# Patient Record
Sex: Female | Born: 1990 | Race: Black or African American | Hispanic: No | Marital: Single | Smoking: Former smoker
Health system: Southern US, Community
[De-identification: ages and names within clinical notes are randomized; demographics above are authoritative.]

## PROBLEM LIST (undated history)

## (undated) ENCOUNTER — Inpatient Hospital Stay (HOSPITAL_COMMUNITY): Payer: Self-pay

## (undated) DIAGNOSIS — B999 Unspecified infectious disease: Secondary | ICD-10-CM

## (undated) DIAGNOSIS — R51 Headache: Secondary | ICD-10-CM

## (undated) HISTORY — PX: KNEE SURGERY: SHX244

---
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
## 2009-08-28 ENCOUNTER — Emergency Department (HOSPITAL_COMMUNITY): Admission: EM | Admit: 2009-08-28 | Discharge: 2009-08-29 | Payer: Self-pay | Admitting: Emergency Medicine

## 2010-06-22 LAB — URINALYSIS, ROUTINE W REFLEX MICROSCOPIC
Glucose, UA: NEGATIVE mg/dL
Nitrite: NEGATIVE
Specific Gravity, Urine: 1.023 (ref 1.005–1.030)
pH: 5.5 (ref 5.0–8.0)

## 2010-06-22 LAB — POCT PREGNANCY, URINE: Preg Test, Ur: NEGATIVE

## 2010-11-07 ENCOUNTER — Emergency Department (HOSPITAL_COMMUNITY)
Admission: EM | Admit: 2010-11-07 | Discharge: 2010-11-07 | Disposition: A | Discharge status: Home / Self Care | Payer: Self-pay | Attending: Emergency Medicine | Admitting: Emergency Medicine

## 2010-11-07 DIAGNOSIS — H109 Unspecified conjunctivitis: Secondary | ICD-10-CM | POA: Insufficient documentation

## 2012-07-04 ENCOUNTER — Encounter (HOSPITAL_COMMUNITY): Payer: Self-pay | Admitting: *Deleted

## 2012-07-04 ENCOUNTER — Emergency Department (INDEPENDENT_AMBULATORY_CARE_PROVIDER_SITE_OTHER)
Admission: EM | Admit: 2012-07-04 | Discharge: 2012-07-04 | Disposition: A | Discharge status: Home / Self Care | Payer: Medicaid Other | Source: Home / Self Care

## 2012-07-04 DIAGNOSIS — R519 Headache, unspecified: Secondary | ICD-10-CM

## 2012-07-04 DIAGNOSIS — R51 Headache: Secondary | ICD-10-CM

## 2012-07-04 HISTORY — DX: Headache: R51

## 2012-07-04 MED ORDER — KETOROLAC TROMETHAMINE 60 MG/2ML IM SOLN
INTRAMUSCULAR | Status: AC
  Filled 2012-07-04: qty 2

## 2012-07-04 MED ORDER — BUTALBITAL-APAP-CAFFEINE 50-325-40 MG PO TABS: ORAL_TABLET | ORAL | Status: DC

## 2012-07-04 MED ORDER — KETOROLAC TROMETHAMINE 60 MG/2ML IM SOLN
60.0000 mg | Freq: Once | INTRAMUSCULAR | Status: AC
  Administered 2012-07-04: 60 mg via INTRAMUSCULAR

## 2012-07-04 MED ORDER — ONDANSETRON HCL 4 MG/2ML IJ SOLN
4.0000 mg | Freq: Once | INTRAMUSCULAR | Status: AC
  Administered 2012-07-04: 4 mg via INTRAMUSCULAR

## 2012-07-04 MED ORDER — ONDANSETRON HCL 4 MG/2ML IJ SOLN
INTRAMUSCULAR | Status: AC
  Filled 2012-07-04: qty 2

## 2012-07-04 NOTE — ED Provider Notes (Signed)
History     CSN: [id number]  Arrival date & time 07/04/12  1609   None     Chief Complaint  Patient presents with  . Headache    (Consider location/radiation/quality/duration/timing/severity/associated sxs/prior treatment) HPI Comments: 22 -year-old female complaining of a migraine for one week. She states she has never actually been diagnosed with migraine headache but this is similar to her previous ones. Her headache started during her childhood. The pain is located across the fore head. It is described as a pulsating type pain and a tightness around the fore head and temples. It is worse with lifting her head. Nothing seems to make it better. She has photophobia and nausea without vomiting. The pain is constant but waxes and wanes in intensity. Sleep usually helps but not this episode. She denies problems with vision speech hearing swallowing, unifocal weakness or paresthesias.   History reviewed. No pertinent past medical history.  No past surgical history on file.  No family history on file.  History  Substance Use Topics  . Smoking status: Not on file  . Smokeless tobacco: Not on file  . Alcohol Use: Not on file    OB History   Grav Para Term Preterm Abortions TAB SAB Ect Mult Living                  Review of Systems  Constitutional: Negative for fever, activity change and fatigue.  HENT: Negative for nosebleeds, sore throat, trouble swallowing, neck pain, neck stiffness and sinus pressure.   Eyes: Positive for photophobia. Negative for pain, discharge and redness.  Respiratory: Negative for cough, shortness of breath and wheezing.   Cardiovascular: Negative for chest pain and palpitations.  Gastrointestinal: Positive for nausea. Negative for vomiting and abdominal pain.  Genitourinary: Negative.   Musculoskeletal: Negative.   Skin: Negative for color change, pallor and rash.  Neurological: Positive for headaches. Negative for tremors, seizures, syncope, facial  asymmetry, speech difficulty and weakness.  Psychiatric/Behavioral: Negative.     Allergies  Review of patient's allergies indicates no known allergies.  Home Medications   Current Outpatient Rx  Name  Route  Sig  Dispense  Refill  . butalbital-acetaminophen-caffeine (FIORICET) 50-325-40 MG per tablet      Take 2 tablets when you get home and go to bed.   2 tablet   0     BP 120/71  Pulse 61  Temp(Src) 98.1 F (36.7 C) (Oral)  Resp 16  SpO2 100%  Physical Exam  Nursing note and vitals reviewed. Constitutional: She is oriented to person, place, and time. She appears well-developed and well-nourished. No distress.  HENT:  Head: Normocephalic and atraumatic.  Mouth/Throat: Oropharynx is clear and moist. No oropharyngeal exudate.  Eyes: Conjunctivae and EOM are normal. Pupils are equal, round, and reactive to light.  Neck: Normal range of motion. Neck supple.  Cardiovascular: Normal rate and normal heart sounds.   Pulmonary/Chest: Effort normal and breath sounds normal. No respiratory distress.  Abdominal: Soft. There is no tenderness.  Musculoskeletal: Normal range of motion.  Neurological: She is alert and oriented to person, place, and time. She has normal strength. She displays no tremor. No cranial nerve deficit or sensory deficit. She exhibits abnormal muscle tone. She displays a negative Romberg sign. GCS eye subscore is 4. GCS verbal subscore is 5. GCS motor subscore is 6.  Unsteady heel to toe gait  Skin: Skin is warm and dry.  Psychiatric: She has a normal mood and affect.  ED Course  Procedures (including critical care time)  Labs Reviewed - No data to display No results found.   1. Headache in front of head       MDM  Toradol 60mg  IM Zofran 4mg  IM Rx for Fioricet #2 tabs. Take 2 tabs when you get home and go to bed Obtain a doctor for further evaluation and management. No neurologic findings. The patient is stable, fully awake, alert and  oriented. Speech is clear with normal contents.       Hayden Rasmussen, NP 07/04/12 1818

## 2012-07-04 NOTE — ED Notes (Signed)
C/O "migraine" since 3/25 - has hx of HAs, but no migraine dx.  Has been trying sleep, Tyl, IBU, Aleve.  Denies n/v.  C/O photosensitivity.

## 2012-07-06 NOTE — ED Provider Notes (Signed)
Medical screening examination/treatment/procedure(s) were performed by resident physician or non-physician practitioner and as supervising physician I was immediately available for consultation/collaboration.   Barkley Bruns MD.   Linna Hoff, MD 07/06/12 1950

## 2012-09-17 ENCOUNTER — Emergency Department (HOSPITAL_COMMUNITY)
Admission: EM | Admit: 2012-09-17 | Discharge: 2012-09-17 | Disposition: A | Discharge status: Home / Self Care | Payer: Medicaid Other | Attending: Emergency Medicine | Admitting: Emergency Medicine

## 2012-09-17 ENCOUNTER — Encounter (HOSPITAL_COMMUNITY): Payer: Self-pay | Admitting: *Deleted

## 2012-09-17 DIAGNOSIS — N390 Urinary tract infection, site not specified: Secondary | ICD-10-CM | POA: Insufficient documentation

## 2012-09-17 DIAGNOSIS — R197 Diarrhea, unspecified: Secondary | ICD-10-CM | POA: Insufficient documentation

## 2012-09-17 DIAGNOSIS — Z3202 Encounter for pregnancy test, result negative: Secondary | ICD-10-CM | POA: Insufficient documentation

## 2012-09-17 DIAGNOSIS — R1110 Vomiting, unspecified: Secondary | ICD-10-CM

## 2012-09-17 DIAGNOSIS — R112 Nausea with vomiting, unspecified: Secondary | ICD-10-CM | POA: Insufficient documentation

## 2012-09-17 LAB — URINALYSIS, ROUTINE W REFLEX MICROSCOPIC
Ketones, ur: NEGATIVE mg/dL
Nitrite: POSITIVE — AB
Specific Gravity, Urine: 1.026 (ref 1.005–1.030)
Urobilinogen, UA: 0.2 mg/dL (ref 0.0–1.0)

## 2012-09-17 LAB — URINE MICROSCOPIC-ADD ON

## 2012-09-17 LAB — POCT PREGNANCY, URINE: Preg Test, Ur: NEGATIVE

## 2012-09-17 LAB — POCT I-STAT, CHEM 8
HCT: 43 % (ref 36.0–46.0)
Sodium: 140 mEq/L (ref 135–145)

## 2012-09-17 MED ORDER — ONDANSETRON HCL 4 MG/2ML IJ SOLN
4.0000 mg | Freq: Once | INTRAMUSCULAR | Status: AC
  Administered 2012-09-17: 4 mg via INTRAVENOUS
  Filled 2012-09-17: qty 2

## 2012-09-17 MED ORDER — ONDANSETRON 4 MG PO TBDP: 4.0000 mg | ORAL_TABLET | Freq: Three times a day (TID) | ORAL | Status: DC | PRN

## 2012-09-17 MED ORDER — SODIUM CHLORIDE 0.9 % IV BOLUS (SEPSIS)
1000.0000 mL | Freq: Once | INTRAVENOUS | Status: AC
  Administered 2012-09-17: 1000 mL via INTRAVENOUS

## 2012-09-17 MED ORDER — CEPHALEXIN 500 MG PO CAPS: 500.0000 mg | ORAL_CAPSULE | Freq: Four times a day (QID) | ORAL | Status: DC

## 2012-09-17 NOTE — ED Notes (Signed)
Able to tolerate fluids. According to patient the PO fluids stimulated diarrhea. Stated she had a small amount of diarrhea

## 2012-09-17 NOTE — ED Provider Notes (Signed)
History     CSN: [id number]  Arrival date & time 09/17/12  0930   First MD Initiated Contact with Patient 09/17/12 1001      Chief Complaint  Patient presents with  . Abdominal Pain    (Consider location/radiation/quality/duration/timing/severity/associated sxs/prior treatment) HPI Comments: Pt states that she started with vomiting and diarrhea this morning at 2 am:pt states that she had fever or 102 which was resolved with tylenol:pt states that she is hurting all over:denies dyuria:pt think related to something she ate  Patient is a 22 y.o. female presenting with abdominal pain. The history is provided by the patient. No language interpreter was used.  Abdominal Pain This is a new problem. The current episode started today. The problem occurs constantly. The problem has been unchanged. Associated symptoms include abdominal pain, nausea and vomiting. Pertinent negatives include no fever. Nothing aggravates the symptoms. She has tried nothing for the symptoms.    Past Medical History  Diagnosis Date  . WUJWJXBJ(478.2)     Past Surgical History  Procedure Laterality Date  . Knee surgery      No family history on file.  History  Substance Use Topics  . Smoking status: Never Smoker   . Smokeless tobacco: Never Used  . Alcohol Use: No    OB History   Grav Para Term Preterm Abortions TAB SAB Ect Mult Living                  Review of Systems  Constitutional: Negative for fever.  Respiratory: Negative.   Cardiovascular: Negative.   Gastrointestinal: Positive for nausea, vomiting and abdominal pain.    Allergies  Review of patient's allergies indicates no known allergies.  Home Medications   Current Outpatient Rx  Name  Route  Sig  Dispense  Refill  . levonorgestrel (MIRENA) 20 MCG/24HR IUD   Intrauterine   1 each by Intrauterine route once.         . naproxen sodium (ANAPROX) 220 MG tablet   Oral   Take 220 mg by mouth 2 (two) times daily with a meal.          BP 127/72  Pulse 84  Temp(Src) 99.1 F (37.3 C) (Oral)  Resp 20  Ht 5\' 4"  (1.626 m)  Wt 213 lb (96.616 kg)  BMI 36.54 kg/m2  SpO2 98%  LMP 09/08/2012  Physical Exam  Nursing note and vitals reviewed. Constitutional: She is oriented to person, place, and time. She appears well-developed and well-nourished.  HENT:  Head: Normocephalic.  Eyes: Conjunctivae and EOM are normal.  Neck: Neck supple.  Cardiovascular: Normal rate and regular rhythm.   Pulmonary/Chest: Effort normal and breath sounds normal.  Abdominal: Soft. Bowel sounds are normal.  Diffuse tenderness  Neurological: She is alert and oriented to person, place, and time.  Skin: Skin is dry.  Psychiatric: She has a normal mood and affect.    ED Course  Procedures (including critical care time)  Labs Reviewed  URINALYSIS, ROUTINE W REFLEX MICROSCOPIC - Abnormal; Notable for the following:    APPearance CLOUDY (*)    Hgb urine dipstick TRACE (*)    Nitrite POSITIVE (*)    Leukocytes, UA SMALL (*)    All other components within normal limits  URINE MICROSCOPIC-ADD ON - Abnormal; Notable for the following:    Squamous Epithelial / LPF FEW (*)    Bacteria, UA MANY (*)    All other components within normal limits  URINE CULTURE  POCT I-STAT, CHEM  8  POCT PREGNANCY, URINE   No results found.   1. UTI (lower urinary tract infection)   2. Vomiting and diarrhea       MDM  Pt is feeling better at this time and is tolerating WU:JWJX treat BJY:NWGNF sent for culture:abdomen appear benign at this time:don't think imaging is needed:pt instructed on when to return       Teressa Lower, NP 09/17/12 1317

## 2012-09-17 NOTE — ED Notes (Signed)
Denies nausea but cramping is 5/10

## 2012-09-17 NOTE — ED Notes (Signed)
Pt states she began having sharp shooting pains in mid abdomen about 2 am today.  Pt also c/o N/V/D since this morning.  Pt states she had a temperature of 102 PTA.  She took Tylenol to treat and her temp is now 99.1.  Pt states she has had some urinary frequency, but denies hematuria.  Pt ate at a [employer] yesterday and she believes she has food poisoning.  Pt is NAD in triage.  Pt's last BM prior to onset of diarrhea was yesterday afternoon @ 13:00.

## 2012-09-17 NOTE — ED Provider Notes (Signed)
Medical screening examination/treatment/procedure(s) were performed by non-physician practitioner and as supervising physician I was immediately available for consultation/collaboration.  Flint Melter, MD 09/17/12 1539

## 2012-09-19 LAB — URINE CULTURE: Colony Count: >=100000

## 2012-09-20 NOTE — ED Notes (Signed)
Post ED Visit - Positive Culture Follow-up  Culture report reviewed by antimicrobial stewardship pharmacist: []  Wes Dulaney, Pharm.D., BCPS []  Celedonio Miyamoto, Pharm.D., BCPS [x]  Georgina Pillion, [address].D., BCPS []  [place], [address].D., BCPS, AAHIVP []  Estella Husk, Pharm.D., BCPS, AAHIVP  Positive urine culture Treated with Cephalexin, organism sensitive to the same and no further patient follow-up is required at this time.  Larena Sox 09/20/2012, 2:52 PM

## 2012-09-25 LAB — POCT PREGNANCY, URINE: Preg Test, Ur: NEGATIVE

## 2012-12-28 ENCOUNTER — Encounter (HOSPITAL_COMMUNITY): Payer: Self-pay | Admitting: Emergency Medicine

## 2012-12-28 ENCOUNTER — Emergency Department (HOSPITAL_COMMUNITY)
Admission: EM | Admit: 2012-12-28 | Discharge: 2012-12-28 | Disposition: A | Discharge status: Home / Self Care | Payer: Medicaid Other | Attending: Emergency Medicine | Admitting: Emergency Medicine

## 2012-12-28 DIAGNOSIS — S63602A Unspecified sprain of left thumb, initial encounter: Secondary | ICD-10-CM

## 2012-12-28 DIAGNOSIS — X58XXXA Exposure to other specified factors, initial encounter: Secondary | ICD-10-CM | POA: Insufficient documentation

## 2012-12-28 DIAGNOSIS — Z79899 Other long term (current) drug therapy: Secondary | ICD-10-CM | POA: Insufficient documentation

## 2012-12-28 DIAGNOSIS — Y929 Unspecified place or not applicable: Secondary | ICD-10-CM | POA: Insufficient documentation

## 2012-12-28 DIAGNOSIS — Y939 Activity, unspecified: Secondary | ICD-10-CM | POA: Insufficient documentation

## 2012-12-28 DIAGNOSIS — S6390XA Sprain of unspecified part of unspecified wrist and hand, initial encounter: Secondary | ICD-10-CM | POA: Insufficient documentation

## 2012-12-28 NOTE — ED Notes (Signed)
Pt reports L thumb pain x2 weeks, no known injury, increasing pain and inability to move thumb when driving.  Pt reports that she has been using heat and ice at home without relief.

## 2012-12-28 NOTE — ED Provider Notes (Signed)
Medical screening examination/treatment/procedure(s) were performed by non-physician practitioner and as supervising physician I was immediately available for consultation/collaboration.  Geoffery Lyons, MD 12/28/12 2303

## 2012-12-28 NOTE — ED Provider Notes (Signed)
This chart was scribed for Arman Filter NP,  a non-physician practitioner working with Geoffery Lyons, MD by Lewanda Rife, ED Scribe. This patient was seen in room WTR8/WTR8 and the patient's care was started at 2028.    CSN: [id number]     Arrival date & time 12/28/12  1946 History   First MD Initiated Contact with Patient 12/28/12 2011     Chief Complaint  Patient presents with  . Finger Injury   (Consider location/radiation/quality/duration/timing/severity/associated sxs/prior Treatment) The history is provided by the patient.   HPI Comments: Cynthia Woodward is a 22 y.o. female who presents to the Emergency Department complaining of constant moderate left thumb pain onset 2 weeks. Describes pain as throbbing. Denies associated known injury. Reports pain is exacerbated by movement and touch. Reports taking Naproxen and ice with mild relief of symptoms.  Past Medical History  Diagnosis Date  . MWNUUVOZ(366.4)    Past Surgical History  Procedure Laterality Date  . Knee surgery    . Wisdom teeth removal  2006   History reviewed. No pertinent family history. History  Substance Use Topics  . Smoking status: Never Smoker   . Smokeless tobacco: Never Used  . Alcohol Use: Yes     Comment: occ.   OB History   Grav Para Term Preterm Abortions TAB SAB Ect Mult Living                 Review of Systems  Constitutional: Negative for fever.  Musculoskeletal: Positive for arthralgias. Negative for joint swelling.  Skin: Negative for color change and wound.  Neurological: Negative for weakness and numbness.  All other systems reviewed and are negative.   A complete 10 system review of systems was obtained and all systems are negative except as noted in the HPI and PMH.    Allergies  Review of patient's allergies indicates no known allergies.  Home Medications   Current Outpatient Rx  Name  Route  Sig  Dispense  Refill  . levonorgestrel (MIRENA) 20 MCG/24HR IUD  Intrauterine   1 each by Intrauterine route once.         . naproxen sodium (ANAPROX) 220 MG tablet   Oral   Take 220 mg by mouth 2 (two) times daily as needed. For pain          BP 139/80  Pulse 73  Temp(Src) 98.6 F (37 C) (Oral)  Resp 16  Ht 5\' 4"  (1.626 m)  Wt 213 lb (96.616 kg)  BMI 36.54 kg/m2  SpO2 100%  LMP 12/20/2012 Physical Exam  Nursing note and vitals reviewed. Constitutional: She is oriented to person, place, and time. She appears well-developed and well-nourished. No distress.  HENT:  Head: Normocephalic and atraumatic.  Eyes: EOM are normal.  Neck: Neck supple. No tracheal deviation present.  Cardiovascular: Normal rate.   Pulmonary/Chest: Effort normal. No respiratory distress.  Musculoskeletal: Normal range of motion.  Left thumb no crepitus, no joint swelling, no click, full ROM  Pain with full ROM of thumb   Neurological: She is alert and oriented to person, place, and time.  Skin: Skin is warm and dry.  Psychiatric: She has a normal mood and affect. Her behavior is normal.    ED Course  Procedures (including critical care time) Medications - No data to display  Labs Review Labs Reviewed - No data to display Imaging Review No results found.  MDM  No diagnosis found.   I personally performed the services described in  this documentation, which was scribed in my presence. The recorded information has been reviewed and is accurate.   Arman Filter, NP 12/28/12 [phone]

## 2013-02-10 ENCOUNTER — Encounter (HOSPITAL_COMMUNITY): Payer: Self-pay | Admitting: Emergency Medicine

## 2013-02-10 ENCOUNTER — Emergency Department (HOSPITAL_COMMUNITY)
Admission: EM | Admit: 2013-02-10 | Discharge: 2013-02-10 | Disposition: A | Discharge status: Home / Self Care | Payer: Medicaid Other | Attending: Emergency Medicine | Admitting: Emergency Medicine

## 2013-02-10 ENCOUNTER — Emergency Department (HOSPITAL_COMMUNITY): Payer: Medicaid Other

## 2013-02-10 DIAGNOSIS — X500XXA Overexertion from strenuous movement or load, initial encounter: Secondary | ICD-10-CM | POA: Insufficient documentation

## 2013-02-10 DIAGNOSIS — S93409A Sprain of unspecified ligament of unspecified ankle, initial encounter: Secondary | ICD-10-CM | POA: Insufficient documentation

## 2013-02-10 DIAGNOSIS — Y92009 Unspecified place in unspecified non-institutional (private) residence as the place of occurrence of the external cause: Secondary | ICD-10-CM | POA: Insufficient documentation

## 2013-02-10 DIAGNOSIS — Y9389 Activity, other specified: Secondary | ICD-10-CM | POA: Insufficient documentation

## 2013-02-10 DIAGNOSIS — S93401A Sprain of unspecified ligament of right ankle, initial encounter: Secondary | ICD-10-CM

## 2013-02-10 MED ORDER — IBUPROFEN 800 MG PO TABS: 800.0000 mg | ORAL_TABLET | Freq: Three times a day (TID) | ORAL | Status: DC | PRN

## 2013-02-10 MED ORDER — HYDROCODONE-ACETAMINOPHEN 5-325 MG PO TABS: 1.0000 | ORAL_TABLET | Freq: Four times a day (QID) | ORAL | Status: DC | PRN

## 2013-02-10 MED ORDER — HYDROCODONE-ACETAMINOPHEN 5-325 MG PO TABS
1.0000 | ORAL_TABLET | Freq: Once | ORAL | Status: AC
  Administered 2013-02-10: 1 via ORAL
  Filled 2013-02-10: qty 1

## 2013-02-10 NOTE — ED Provider Notes (Signed)
CSN: [id number]     Arrival date & time 02/10/13  1138 History  This chart was scribed for non-physician practitioner Ebbie Ridge, PA,  working with Lyanne Co, MD by Ronal Fear, ED scribe. This patient was seen in room WTR8/WTR8 and the patient's care was started at 11:52 AM.    Chief Complaint  Patient presents with  . Ankle Injury   The history is provided by the patient. No language interpreter was used.    HPI Comments: Cynthia Woodward is a 22 y.o. female who presents to the Emergency Department complaining of sudden onset burning left ankle pain that she believes is a result of coming down on her ankle the wrong way while walking her dog last night. Pt states that afterwards in her home she was reaching up into a cabinet and heard a loud pop with sudden onset pain that is worse with movement and touching. She has taken aleve for the pain with no relief. Pt has a hx of left knee surgery. Pt does not appear to be in any acute distress with no other complaints.  Past Medical History  Diagnosis Date  . WUJWJXBJ(478.2)    Past Surgical History  Procedure Laterality Date  . Knee surgery    . Wisdom teeth removal  2006   No family history on file. History  Substance Use Topics  . Smoking status: Never Smoker   . Smokeless tobacco: Never Used  . Alcohol Use: Yes     Comment: occ.   OB History   Grav Para Term Preterm Abortions TAB SAB Ect Mult Living                 Review of Systems  Musculoskeletal: Positive for arthralgias.  All other systems reviewed and are negative.    Allergies  Review of patient's allergies indicates no known allergies.  Home Medications   Current Outpatient Rx  Name  Route  Sig  Dispense  Refill  . levonorgestrel (MIRENA) 20 MCG/24HR IUD   Intrauterine   1 each by Intrauterine route once.         . naproxen sodium (ANAPROX) 220 MG tablet   Oral   Take 220 mg by mouth 2 (two) times daily as needed. For pain          BP 126/67   Pulse 85  Temp(Src) 97.9 F (36.6 C) (Oral)  Resp 18  SpO2 98%  LMP 02/05/2013 Physical Exam  Nursing note and vitals reviewed. Constitutional: She is oriented to person, place, and time. She appears well-developed and well-nourished.  HENT:  Head: Normocephalic and atraumatic.  Eyes: Pupils are equal, round, and reactive to light.  Pulmonary/Chest: Effort normal.  Musculoskeletal: She exhibits edema (mild edema in left ankle) and tenderness (left ankle).  Neurological: She is alert and oriented to person, place, and time.  Skin: Skin is warm and dry.  Psychiatric: She has a normal mood and affect.    ED Course  Procedures (including critical care time) DIAGNOSTIC STUDIES: Oxygen Saturation is 98% on RA, normal by my interpretation.    COORDINATION OF CARE:    11:57 AM- Pt advised of plan for treatment including Xray of her left ankle and pt agrees.  I personally performed the services described in this documentation, which was scribed in my presence. The recorded information has been reviewed and is accurate. Patient be placed in an ASO with crutches.  Told to followup with orthopedics, to ice and elevate her ankle.  Patient is given the x-ray results  Carlyle Dolly, PA-C 02/10/13 1303  Carlyle Dolly, PA-C 02/10/13 [phone]

## 2013-02-10 NOTE — ED Provider Notes (Signed)
Medical screening examination/treatment/procedure(s) were performed by non-physician practitioner and as supervising physician I was immediately available for consultation/collaboration.  EKG Interpretation   None         Heinrich Fertig M Alletta Mattos, MD 02/10/13 1559 

## 2013-02-10 NOTE — ED Notes (Signed)
Pt reports walking her dog around midnight, was walking down hill and thinks she injured ankle - states when she got home she was reaching for something on a shelf, heard a pop from ankle. States ankle has been "on fire" since.

## 2013-04-25 ENCOUNTER — Encounter (HOSPITAL_COMMUNITY): Payer: Self-pay | Admitting: Emergency Medicine

## 2013-04-25 ENCOUNTER — Emergency Department (HOSPITAL_COMMUNITY)
Admission: EM | Admit: 2013-04-25 | Discharge: 2013-04-25 | Disposition: A | Discharge status: Home / Self Care | Payer: Medicaid Other | Attending: Emergency Medicine | Admitting: Emergency Medicine

## 2013-04-25 DIAGNOSIS — J069 Acute upper respiratory infection, unspecified: Secondary | ICD-10-CM | POA: Insufficient documentation

## 2013-04-25 DIAGNOSIS — H9209 Otalgia, unspecified ear: Secondary | ICD-10-CM | POA: Insufficient documentation

## 2013-04-25 LAB — RAPID STREP SCREEN (MED CTR MEBANE ONLY): Streptococcus, Group A Screen (Direct): NEGATIVE

## 2013-04-25 MED ORDER — HYDROCODONE-HOMATROPINE 5-1.5 MG/5ML PO SYRP: 5.0000 mL | ORAL_SOLUTION | Freq: Four times a day (QID) | ORAL | Status: DC | PRN

## 2013-04-25 MED ORDER — DIPHENHYDRAMINE HCL 25 MG PO TABS: 25.0000 mg | ORAL_TABLET | Freq: Four times a day (QID) | ORAL | Status: DC | PRN

## 2013-04-25 NOTE — ED Notes (Signed)
Pt escorted to discharge window. Pt verbalized understanding discharge instructions. In no acute distress.  

## 2013-04-25 NOTE — ED Provider Notes (Signed)
CSN: [id number]     Arrival date & time 04/25/13  1203 History  This chart was scribed for non-physician practitioner Emilia BeckKaitlyn Janel Beane, PA-C working with Laray AngerKathleen M McManus, DO by Leone PayorSonum Patel, ED Scribe. This patient was seen in room WTR7/WTR7 and the patient's care was started at 1203.    Chief Complaint  Patient presents with  . Cough  . Sore Throat    The history is provided by the patient. No language interpreter was used.    HPI Comments: Cynthia Woodward is a 23 y.o. female who presents to the Emergency Department complaining of 1 week of gradual onset, gradually worsening, constant cough productive of clear sputum. She has associated rhinorrhea productive of yellow mucus, sore throat, ear pain. She states the cough causes her to have trouble with deep breathing. She has tried OTC cough medication, cough drops without relief. She reports sick contacts with similar symptoms. She denies fever, chills.    Past Medical History  Diagnosis Date  . ZHYQMVHQ(469.6Headache(784.0)    Past Surgical History  Procedure Laterality Date  . Knee surgery    . Wisdom teeth removal  2006   History reviewed. No pertinent family history. History  Substance Use Topics  . Smoking status: Never Smoker   . Smokeless tobacco: Never Used  . Alcohol Use: Yes     Comment: occ.   OB History   Grav Para Term Preterm Abortions TAB SAB Ect Mult Living                 Review of Systems  Constitutional: Negative for fever, chills and fatigue.  HENT: Positive for ear pain, rhinorrhea and sore throat. Negative for trouble swallowing.   Eyes: Negative for visual disturbance.  Respiratory: Positive for cough. Negative for shortness of breath.   Cardiovascular: Negative for chest pain and palpitations.  Gastrointestinal: Negative for nausea, vomiting, abdominal pain and diarrhea.  Genitourinary: Negative for dysuria and difficulty urinating.  Musculoskeletal: Negative for arthralgias and neck pain.  Skin: Negative for  color change.  Neurological: Negative for dizziness and weakness.  Psychiatric/Behavioral: Negative for dysphoric mood.    Allergies  Review of patient's allergies indicates no known allergies.  Home Medications   Current Outpatient Rx  Name  Route  Sig  Dispense  Refill  . levonorgestrel (MIRENA) 20 MCG/24HR IUD   Intrauterine   1 each by Intrauterine route once.          . naproxen sodium (ANAPROX) 220 MG tablet   Oral   Take 440 mg by mouth 2 (two) times daily as needed (For headache). For pain          BP 124/82  Pulse 64  Temp(Src) 98.4 F (36.9 C) (Oral)  Resp 12  SpO2 97%  LMP 04/18/2013 Physical Exam  Nursing note and vitals reviewed. Constitutional: She is oriented to person, place, and time. She appears well-developed and well-nourished. No distress.  HENT:  Head: Normocephalic and atraumatic.  Right Ear: External ear normal.  Left Ear: External ear normal.  Mouth/Throat: Oropharynx is clear and moist. No oropharyngeal exudate.  No sinus tenderness to palpation.   Eyes: Conjunctivae and EOM are normal. Pupils are equal, round, and reactive to light.  Neck: Normal range of motion. Neck supple.  Cardiovascular: Normal rate, regular rhythm and normal heart sounds.  Exam reveals no gallop and no friction rub.   No murmur heard. Pulmonary/Chest: Effort normal and breath sounds normal. No respiratory distress. She has no wheezes. She has  no rales. She exhibits no tenderness.  Abdominal: Soft. She exhibits no distension. There is no tenderness.  Musculoskeletal: Normal range of motion.  Lymphadenopathy:    She has no cervical adenopathy.  Neurological: She is alert and oriented to person, place, and time.  Speech is goal-oriented. Moves limbs without ataxia.   Skin: Skin is warm and dry.  Psychiatric: She has a normal mood and affect.    ED Course  Procedures (including critical care time)  DIAGNOSTIC STUDIES: Oxygen Saturation is 97% on RA, adequate by  my interpretation.    COORDINATION OF CARE: 1:09 PM Will treat patient symptomatically with Hycodan and benadryl. Discussed treatment plan with pt at bedside and pt agreed to plan.   Labs Review Labs Reviewed  RAPID STREP SCREEN  CULTURE, GROUP A STREP   Imaging Review No results found.  EKG Interpretation   None       MDM   1. URI (upper respiratory infection)    Patient likely has viral illness. Patient will have hycodan and benadryl for symptoms. Patient advised to return with worsening or concerning symptoms.   I personally performed the services described in this documentation, which was scribed in my presence. The recorded information has been reviewed and is accurate.   Emilia Beck, PA-C 04/25/13 1757  Emilia Beck, PA-C 04/25/13 1757

## 2013-04-25 NOTE — ED Notes (Signed)
Pt c/o cough and sore throat x 1 wk.  Sick kids in the house.

## 2013-04-25 NOTE — Discharge Instructions (Signed)
Take hycodan as needed for cough. Take benadryl as needed for congestion. Refer to attached documents for more information.

## 2013-04-25 NOTE — ED Notes (Signed)
Pt alert and oriented x4. Respirations even and unlabored, bilateral symmetrical rise and fall of chest. Skin warm and dry. In no acute distress. Denies needs.   

## 2013-04-26 NOTE — ED Provider Notes (Signed)
Medical screening examination/treatment/procedure(s) were performed by non-physician practitioner and as supervising physician I was immediately available for consultation/collaboration.  EKG Interpretation   None         Laray AngerKathleen M Joncarlo Friberg, DO 04/26/13 [phone]

## 2013-04-27 LAB — CULTURE, GROUP A STREP

## 2013-06-14 ENCOUNTER — Encounter (HOSPITAL_COMMUNITY): Payer: Self-pay | Admitting: Emergency Medicine

## 2013-06-14 ENCOUNTER — Emergency Department (HOSPITAL_COMMUNITY)
Admission: EM | Admit: 2013-06-14 | Discharge: 2013-06-14 | Disposition: A | Discharge status: Home / Self Care | Payer: Medicaid Other | Attending: Emergency Medicine | Admitting: Emergency Medicine

## 2013-06-14 DIAGNOSIS — M5416 Radiculopathy, lumbar region: Secondary | ICD-10-CM

## 2013-06-14 DIAGNOSIS — IMO0002 Reserved for concepts with insufficient information to code with codable children: Secondary | ICD-10-CM | POA: Insufficient documentation

## 2013-06-14 DIAGNOSIS — M25579 Pain in unspecified ankle and joints of unspecified foot: Secondary | ICD-10-CM | POA: Insufficient documentation

## 2013-06-14 MED ORDER — PREDNISONE 20 MG PO TABS: 40.0000 mg | ORAL_TABLET | Freq: Every day | ORAL | Status: DC

## 2013-06-14 MED ORDER — TRAMADOL HCL 50 MG PO TABS: 50.0000 mg | ORAL_TABLET | Freq: Four times a day (QID) | ORAL | Status: DC | PRN

## 2013-06-14 MED ORDER — IBUPROFEN 600 MG PO TABS: 600.0000 mg | ORAL_TABLET | Freq: Four times a day (QID) | ORAL | Status: DC | PRN

## 2013-06-14 NOTE — ED Provider Notes (Signed)
CSN: [id number]     Arrival date & time 06/14/13  1813 History  This chart was scribed for non-physician practitioner, Jaynie Crumbleatyana Rheya Minogue, PA-C,working with Ward GivensIva L Knapp, MD, by Karle PlumberJennifer Tensley, ED Scribe.  This patient was seen in room WTR8/WTR8 and the patient's care was started at 7:01 PM.  Chief Complaint  Patient presents with  . Foot Pain  . Leg Pain   The history is provided by the patient. No language interpreter was used.   HPI Comments:  Cynthia Woodward is a 23 y.o. obese female who presents to the Emergency Department complaining of intermittent sharp shooting pains that start in the feet and radiate up the legs bilaterally at varying times that last only a few seconds at a time. She states this has been going on for approximately one week. She states it is "the most excruciating pain" she has ever experienced. She states the pain comes on at random times and nothing makes the pain worse. She reports only getting relief from lying completely still. She states she has soaked in warm baths with no relief. She denies h/o DM, back problems, injury, or trauma to the feet or legs. She reports not having a PCP.   Past Medical History  Diagnosis Date  . [id number](478.2Headache(784.0)    Past Surgical History  Procedure Laterality Date  . Knee surgery    . Wisdom teeth removal  2006   No family history on file. History  Substance Use Topics  . Smoking status: Never Smoker   . Smokeless tobacco: Never Used  . Alcohol Use: Yes     Comment: occ.   OB History   Grav Para Term Preterm Abortions TAB SAB Ect Mult Living                 Review of Systems  Musculoskeletal: Positive for arthralgias (bilateral feet). Negative for back pain.  Neurological: Negative for weakness and numbness.    Allergies  Review of patient's allergies indicates no known allergies.  Home Medications   Current Outpatient Rx  Name  Route  Sig  Dispense  Refill  . diphenhydrAMINE (BENADRYL) 25 MG tablet   Oral  Take 1 tablet (25 mg total) by mouth every 6 (six) hours as needed for itching (Rash).   30 tablet   0   . HYDROcodone-homatropine (HYCODAN) 5-1.5 MG/5ML syrup   Oral   Take 5 mLs by mouth every 6 (six) hours as needed for cough.   120 mL   0   . levonorgestrel (MIRENA) 20 MCG/24HR IUD   Intrauterine   1 each by Intrauterine route once.          . naproxen sodium (ANAPROX) 220 MG tablet   Oral   Take 440 mg by mouth 2 (two) times daily as needed (For headache). For pain          Triage Vitals: BP 131/81  Pulse 66  Temp(Src) 98.7 F (37.1 C) (Oral)  Resp 18  SpO2 100%  LMP 06/05/2013 Physical Exam  Nursing note and vitals reviewed. Constitutional: She is oriented to person, place, and time. She appears well-developed and well-nourished.  HENT:  Head: Normocephalic and atraumatic.  Eyes: EOM are normal.  Neck: Normal range of motion.  Cardiovascular: Normal rate and intact distal pulses.   Dorsal pedal pulses intact bilaterally  Pulmonary/Chest: Effort normal.  Musculoskeletal: Normal range of motion.  No midline lumbar spine tenderness. No pain with bilateral straight leg raise. No calf pain or swelling  Neurological: She is alert and oriented to person, place, and time. She has normal reflexes.  5/5 and equal lower extremity strength. 2+ and equal patellar reflexes bilaterally. Pt able to dorsiflex bilateral toes and feet with good strength against resistance. Equal sensation bilaterally over thighs and lower legs.   Skin: Skin is warm and dry.  Psychiatric: She has a normal mood and affect. Her behavior is normal.    ED Course  Procedures (including critical care time) DIAGNOSTIC STUDIES: Oxygen Saturation is 100% on RA, normal by my interpretation.   COORDINATION OF CARE: 7:07 PM- Will prescribe steroids and give referral to orthopedist. Pt verbalizes understanding and agrees to plan.  Medications - No data to display  Labs Review Labs Reviewed - No data  to display Imaging Review No results found.   EKG Interpretation None      MDM   Final diagnoses:  Radiculopathy, lumbar region    Pt with non specific sharp, shooting pain in both legs from toes to the thighs. No numbness or weakness. Normal exam. Neurovascularly intact. I am unable to reproduce her pain.  Unsure exact cause. Suspect could be radicular pain from lumbar spine. Will try prednisone. NSAIDs, Ultram. Follow up with pcp or orthopedics.    Filed Vitals:   06/14/13 1849  BP: 131/81  Pulse: 66  Temp: 98.7 F (37.1 C)  TempSrc: Oral  Resp: 18  SpO2: 100%     I personally performed the services described in this documentation, which was scribed in my presence. The recorded information has been reviewed and is accurate.    Lottie Mussel, PA-C 06/14/13 2020

## 2013-06-14 NOTE — Discharge Instructions (Signed)
Start taking medications as prescribed. Please follow up with primary care doctor or orthopedics specialist if not improving.   Radicular Pain Radicular pain in either the arm or leg is usually from a bulging or herniated disk in the spine. A piece of the herniated disk may press against the nerves as the nerves exit the spine. This causes pain which is felt at the tips of the nerves down the arm or leg. Other causes of radicular pain may include:  Fractures.  Heart disease.  Cancer.  An abnormal and usually degenerative state of the nervous system or nerves (neuropathy). Diagnosis may require CT or MRI scanning to determine the primary cause.  Nerves that start at the neck (nerve roots) may cause radicular pain in the outer shoulder and arm. It can spread down to the thumb and fingers. The symptoms vary depending on which nerve root has been affected. In most cases radicular pain improves with conservative treatment. Neck problems may require physical therapy, a neck collar, or cervical traction. Treatment may take many weeks, and surgery may be considered if the symptoms do not improve.  Conservative treatment is also recommended for sciatica. Sciatica causes pain to radiate from the lower back or buttock area down the leg into the foot. Often there is a history of back problems. Most patients with sciatica are better after 2 to 4 weeks of rest and other supportive care. Short term bed rest can reduce the disk pressure considerably. Sitting, however, is not a good position since this increases the pressure on the disk. You should avoid bending, lifting, and all other activities which make the problem worse. Traction can be used in severe cases. Surgery is usually reserved for patients who do not improve within the first months of treatment. Only take over-the-counter or prescription medicines for pain, discomfort, or fever as directed by your caregiver. Narcotics and muscle relaxants may help by  relieving more severe pain and spasm and by providing mild sedation. Cold or massage can give significant relief. Spinal manipulation is not recommended. It can increase the degree of disc protrusion. Epidural steroid injections are often effective treatment for radicular pain. These injections deliver medicine to the spinal nerve in the space between the protective covering of the spinal cord and back bones (vertebrae). Your caregiver can give you more information about steroid injections. These injections are most effective when given within two weeks of the onset of pain.  You should see your caregiver for follow up care as recommended. A program for neck and back injury rehabilitation with stretching and strengthening exercises is an important part of management.  SEEK IMMEDIATE MEDICAL CARE IF:  You develop increased pain, weakness, or numbness in your arm or leg.  You develop difficulty with bladder or bowel control.  You develop abdominal pain. Document Released: 04/29/2004 Document Revised: 06/14/2011 Document Reviewed: 07/15/2008 [facility] Patient Information 2014 [place], [place].

## 2013-06-14 NOTE — ED Notes (Addendum)
Pt c/o bilat feet pain that radiates up to her upper thig that has been going on intermittently for about week.

## 2013-06-15 NOTE — ED Provider Notes (Signed)
Medical screening examination/treatment/procedure(s) were performed by non-physician practitioner and as supervising physician I was immediately available for consultation/collaboration.   EKG Interpretation None        Cynthia Woodward S Shyvonne Chastang, MD 06/15/13 0825 

## 2014-11-21 IMAGING — CR DG ANKLE COMPLETE 3+V*L*
3 series · 3 of 3 positions shown · non-contrast
Comparison: None.

CLINICAL DATA: Left ankle pain post twisted ankle

EXAM:
LEFT ANKLE COMPLETE - 3+ VIEW

[x ankle ap left]
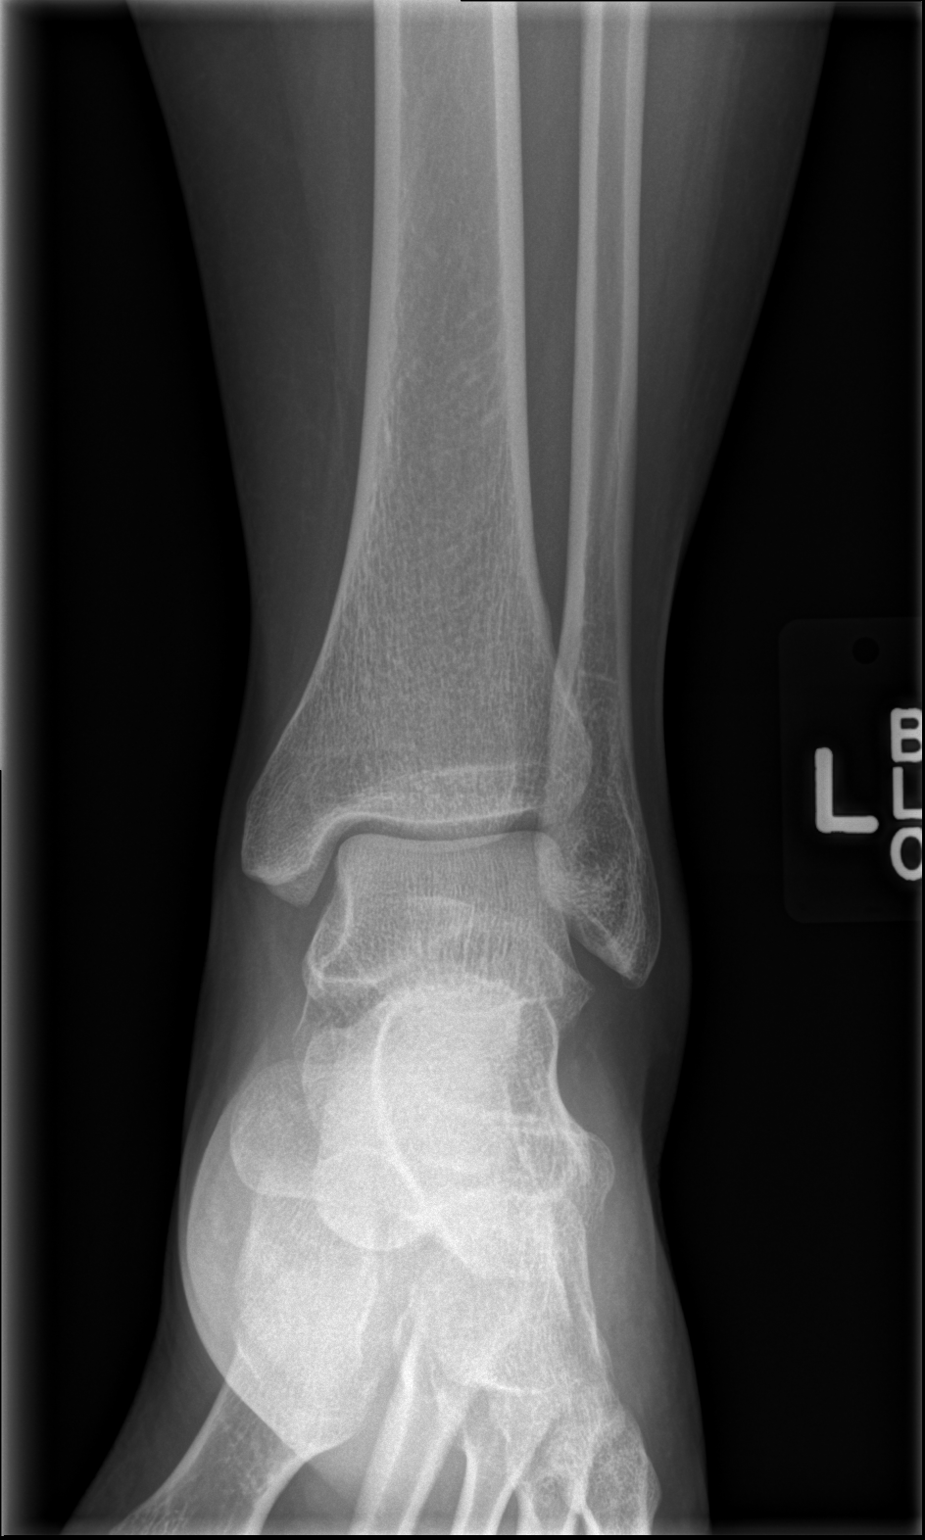

[x ankle obl left]
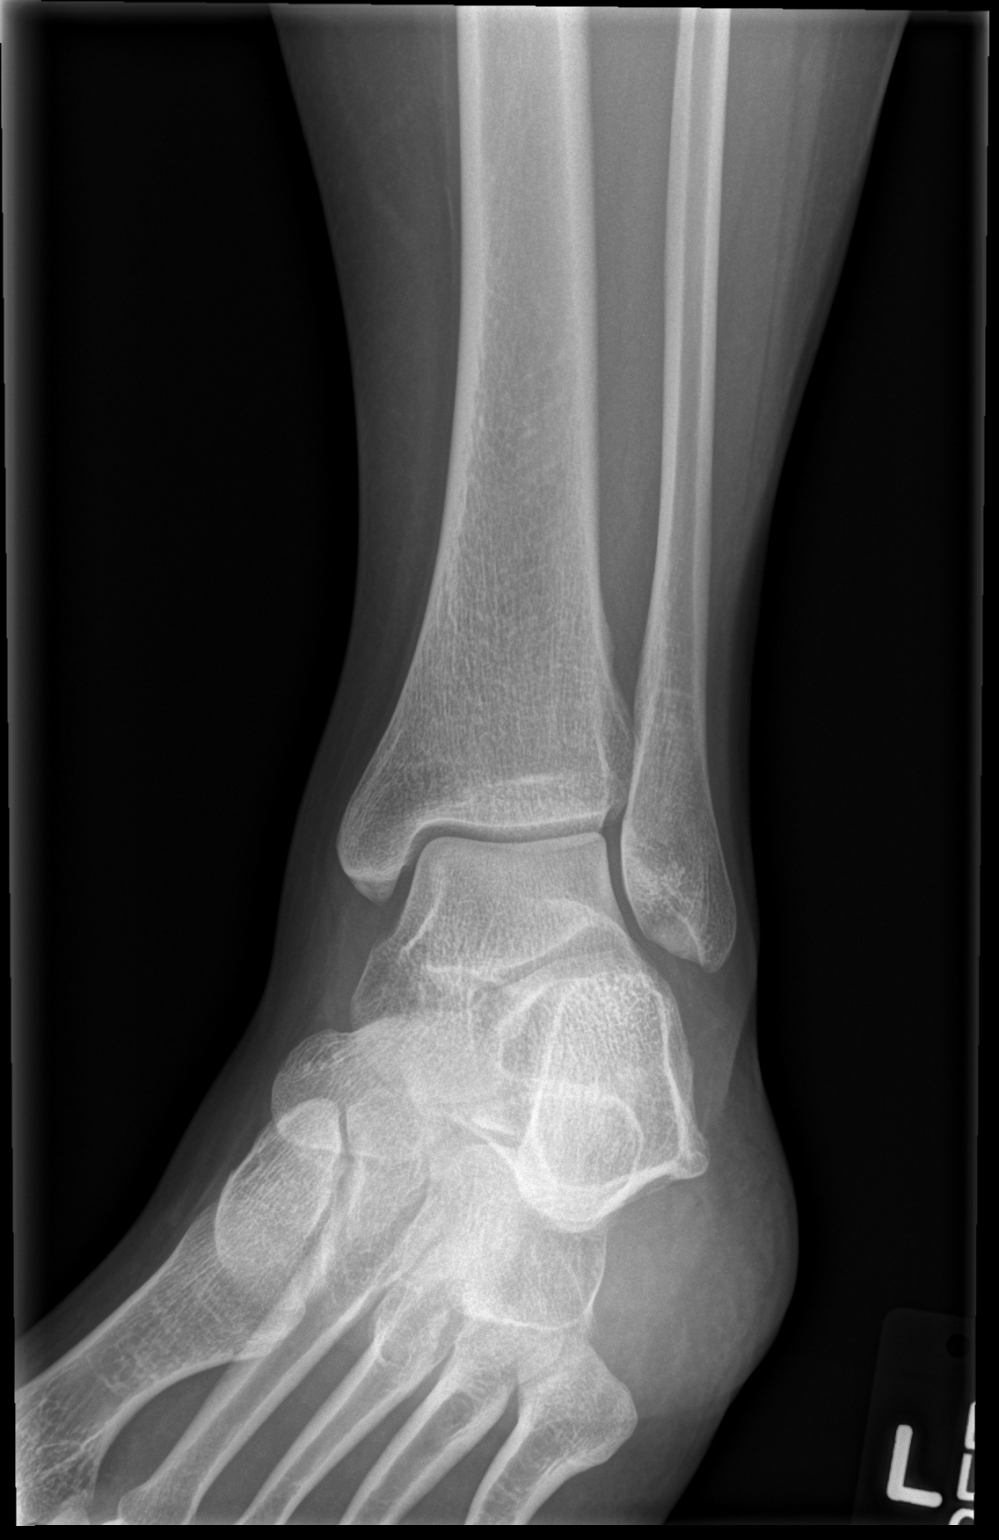

[x ankle lat left]
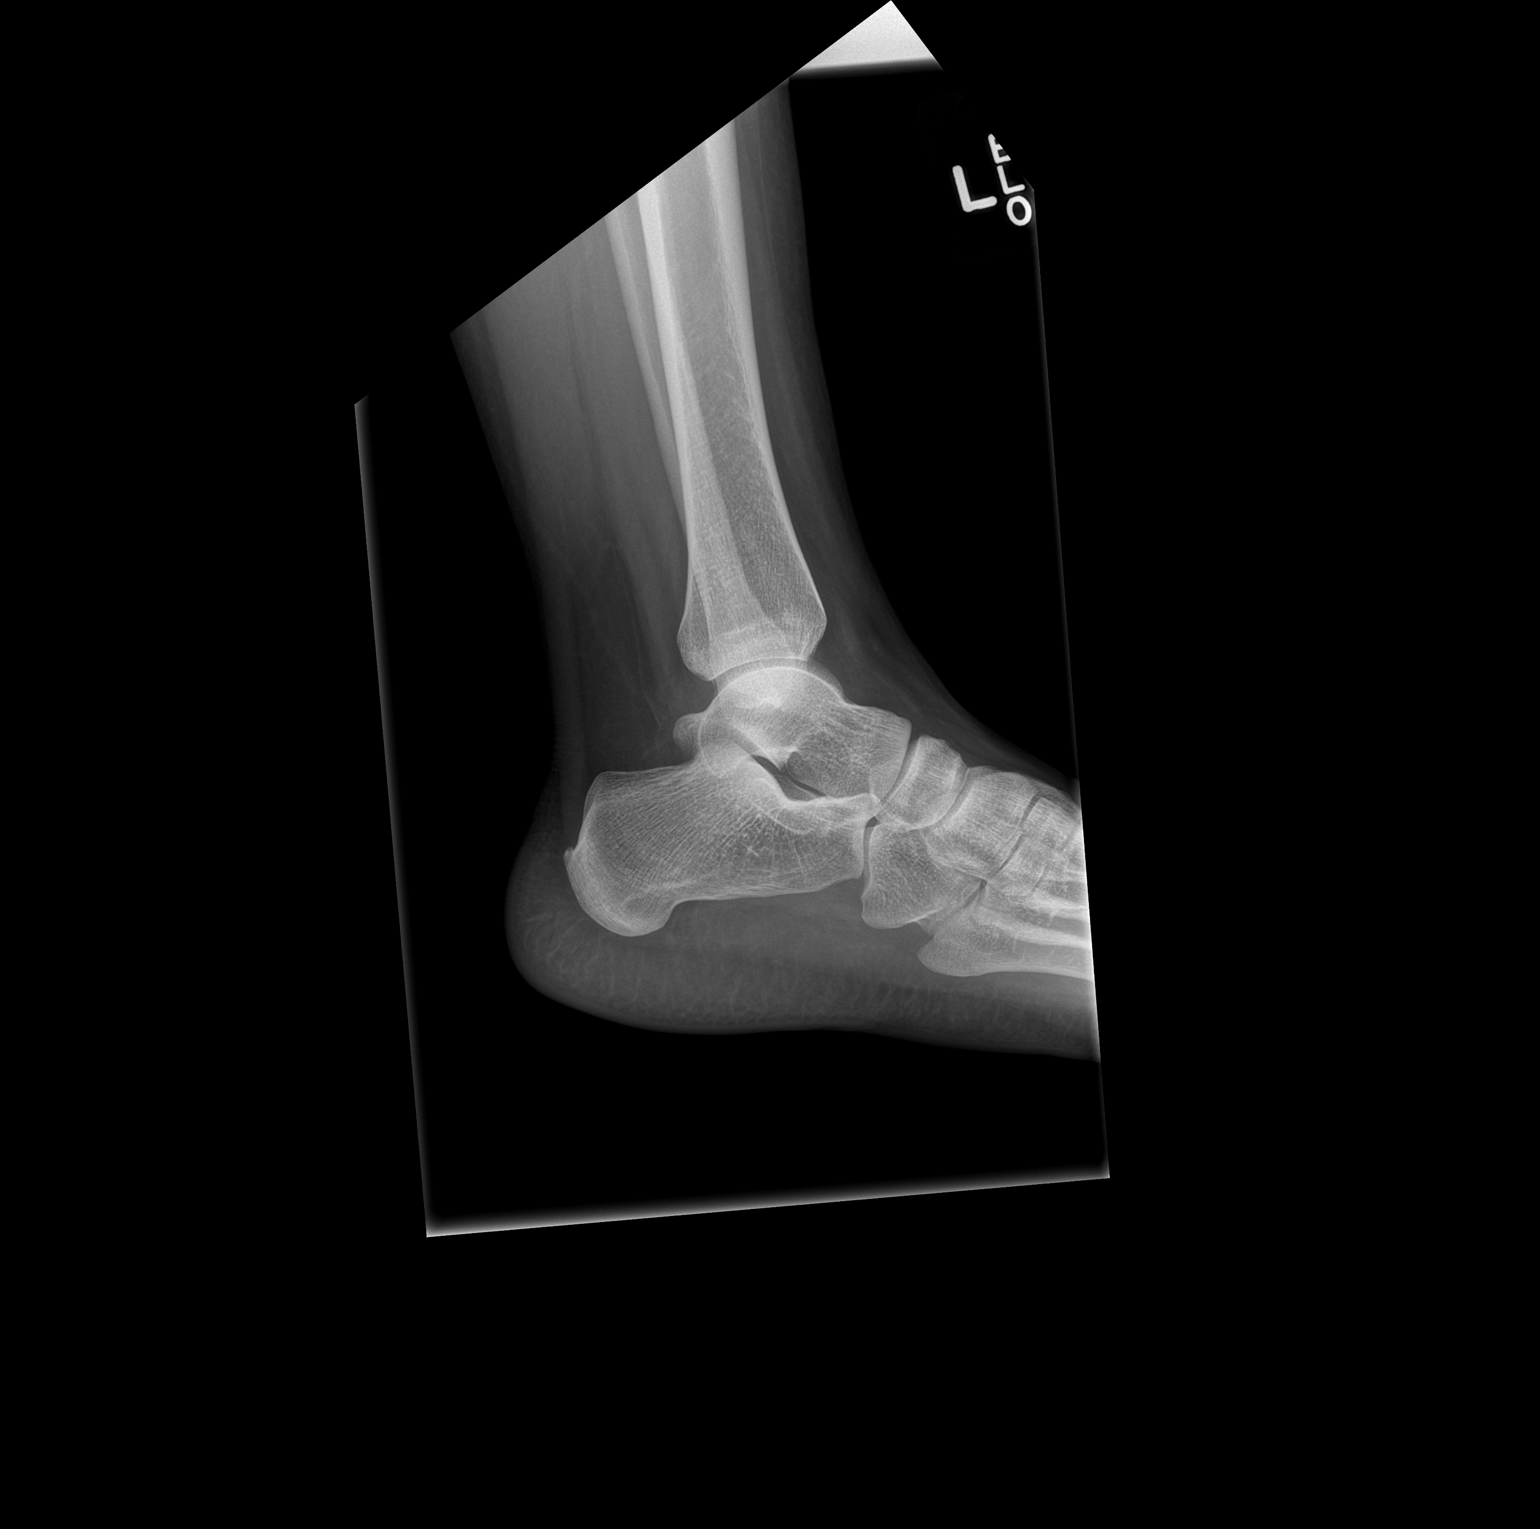

[3 of 3 positions shown; findings below may reference images not displayed]

FINDINGS: Three views of left ankle submitted. No acute fracture or
subluxation. No radiopaque foreign body. Ankle mortise is preserved.
IMPRESSION: No acute fracture or subluxation.

## 2015-05-26 ENCOUNTER — Other Ambulatory Visit: Payer: Self-pay | Admitting: Family Medicine

## 2015-05-26 ENCOUNTER — Ambulatory Visit
Admission: RE | Admit: 2015-05-26 | Discharge: 2015-05-26 | Disposition: A | Discharge status: Home / Self Care | Payer: Managed Care, Other (non HMO) | Source: Ambulatory Visit | Attending: Family Medicine | Admitting: Family Medicine

## 2015-05-26 DIAGNOSIS — R52 Pain, unspecified: Secondary | ICD-10-CM

## 2016-04-19 DIAGNOSIS — F419 Anxiety disorder, unspecified: Secondary | ICD-10-CM | POA: Diagnosis not present

## 2016-04-19 DIAGNOSIS — R51 Headache: Secondary | ICD-10-CM | POA: Diagnosis not present

## 2016-06-28 DIAGNOSIS — Z3040 Encounter for surveillance of contraceptives, unspecified: Secondary | ICD-10-CM | POA: Diagnosis not present

## 2016-06-28 DIAGNOSIS — Z113 Encounter for screening for infections with a predominantly sexual mode of transmission: Secondary | ICD-10-CM | POA: Diagnosis not present

## 2016-06-28 DIAGNOSIS — Z01419 Encounter for gynecological examination (general) (routine) without abnormal findings: Secondary | ICD-10-CM | POA: Diagnosis not present

## 2016-11-25 DIAGNOSIS — F419 Anxiety disorder, unspecified: Secondary | ICD-10-CM | POA: Diagnosis not present

## 2016-11-25 DIAGNOSIS — R51 Headache: Secondary | ICD-10-CM | POA: Diagnosis not present

## 2016-12-22 DIAGNOSIS — N926 Irregular menstruation, unspecified: Secondary | ICD-10-CM | POA: Diagnosis not present

## 2017-01-05 DIAGNOSIS — Z30432 Encounter for removal of intrauterine contraceptive device: Secondary | ICD-10-CM | POA: Diagnosis not present

## 2017-01-05 DIAGNOSIS — N926 Irregular menstruation, unspecified: Secondary | ICD-10-CM | POA: Diagnosis not present

## 2017-03-05 IMAGING — CR DG TOE 4TH 2+V*R*
3 series · 3 of 3 positions shown · non-contrast
Comparison: None.

CLINICAL DATA: 24-year-old female hit fourth toe 05/25/2015. Pain.
Initial encounter.

EXAM:
RIGHT FOURTH TOE

[view not recorded (1 of 3)]
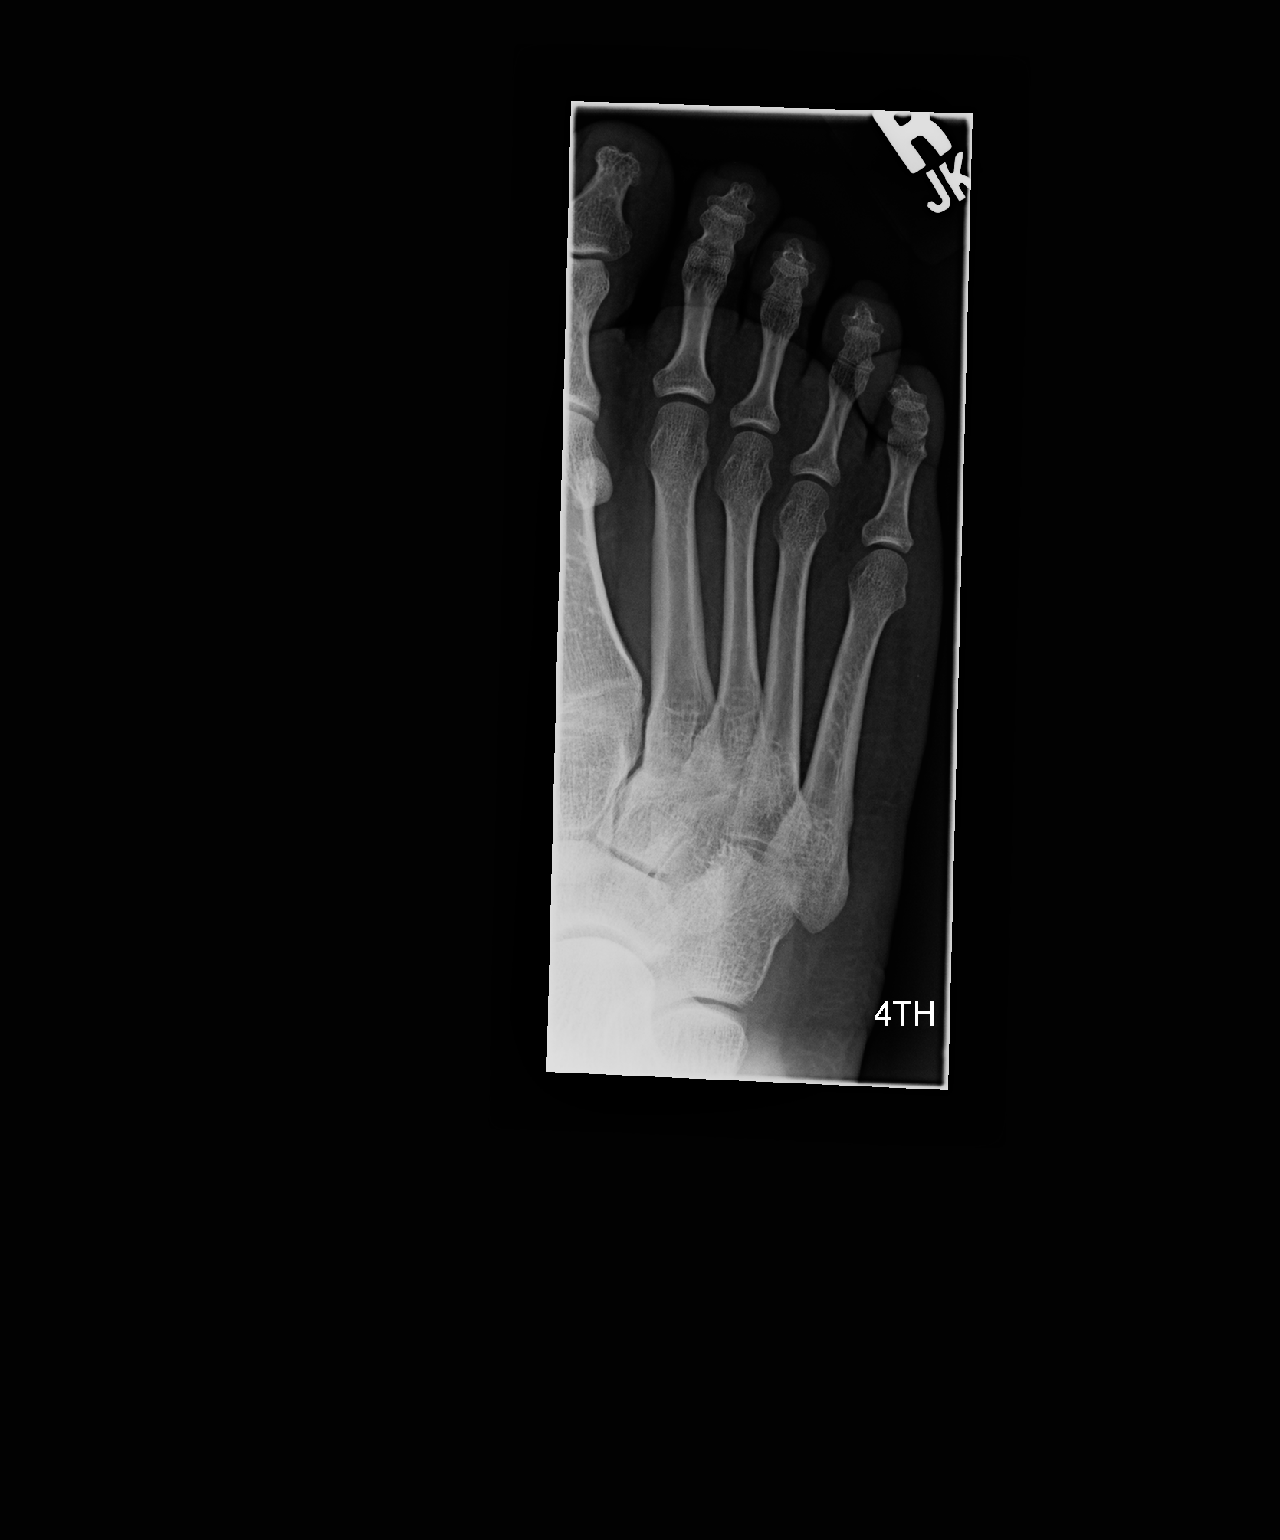

[view not recorded (2 of 3)]
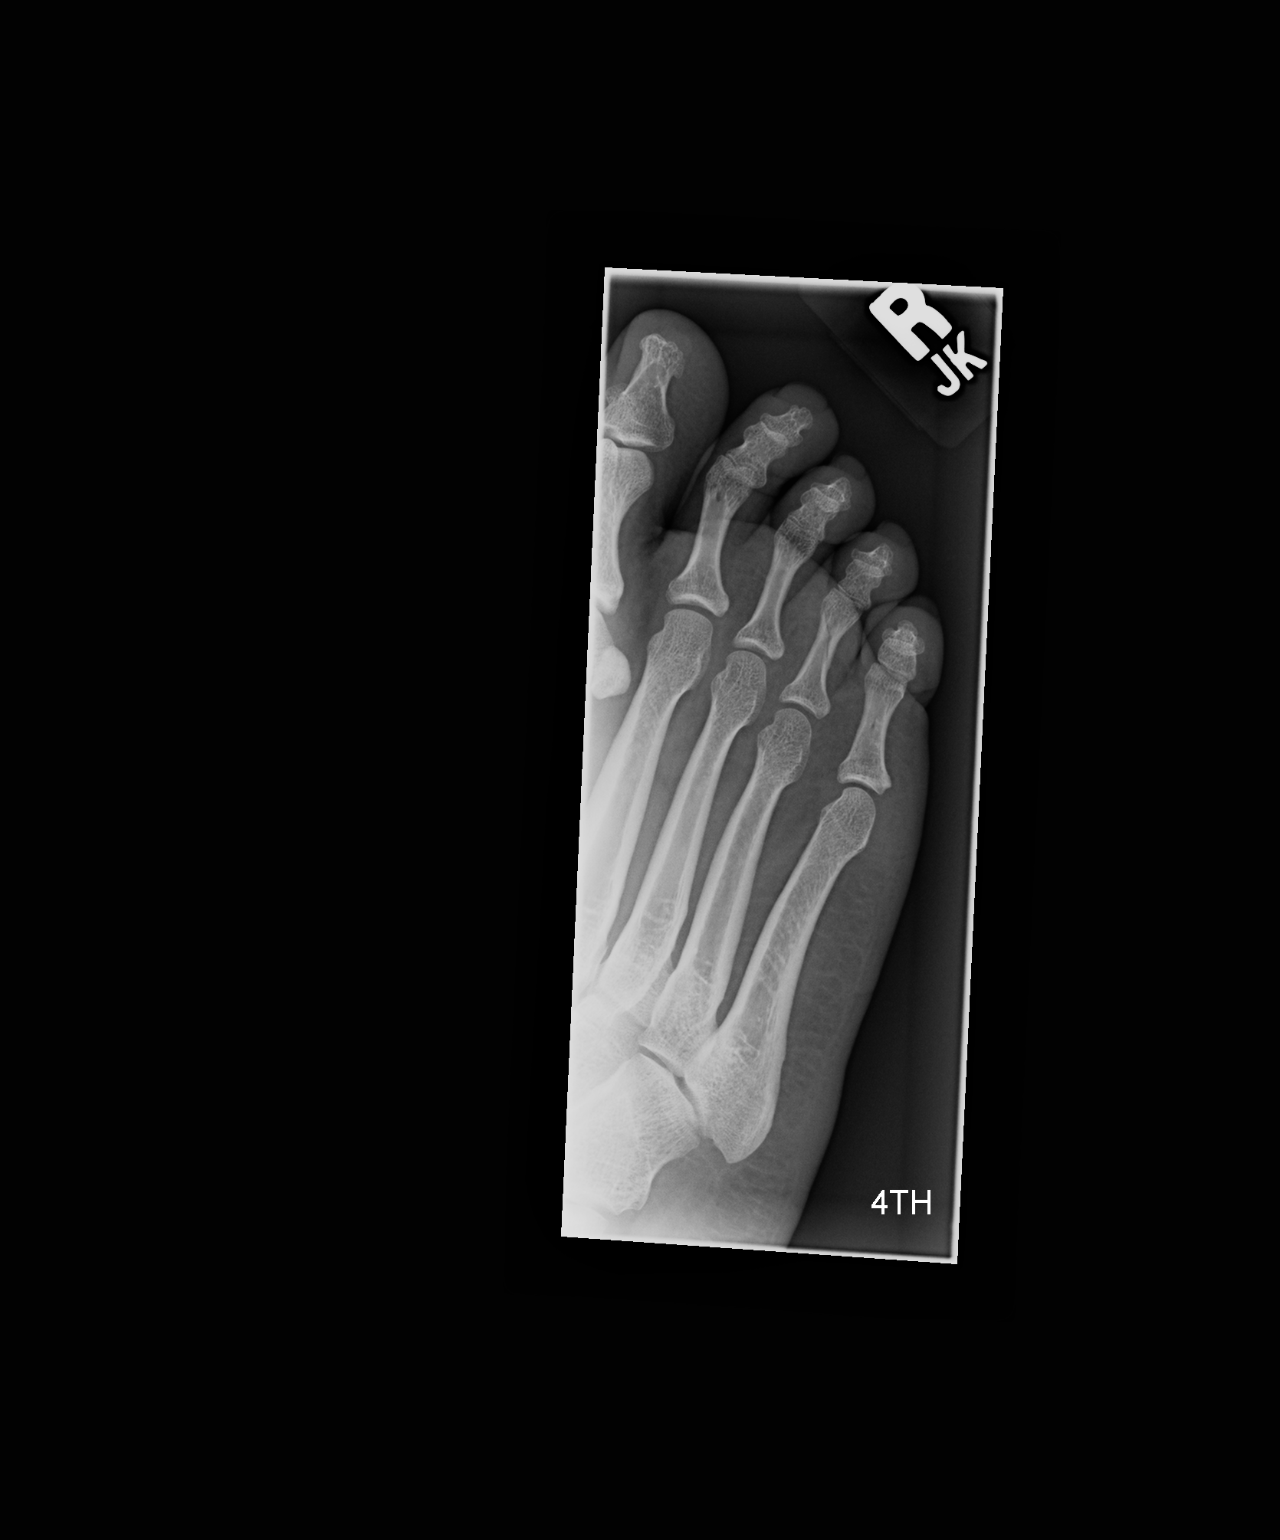

[view not recorded (3 of 3)]
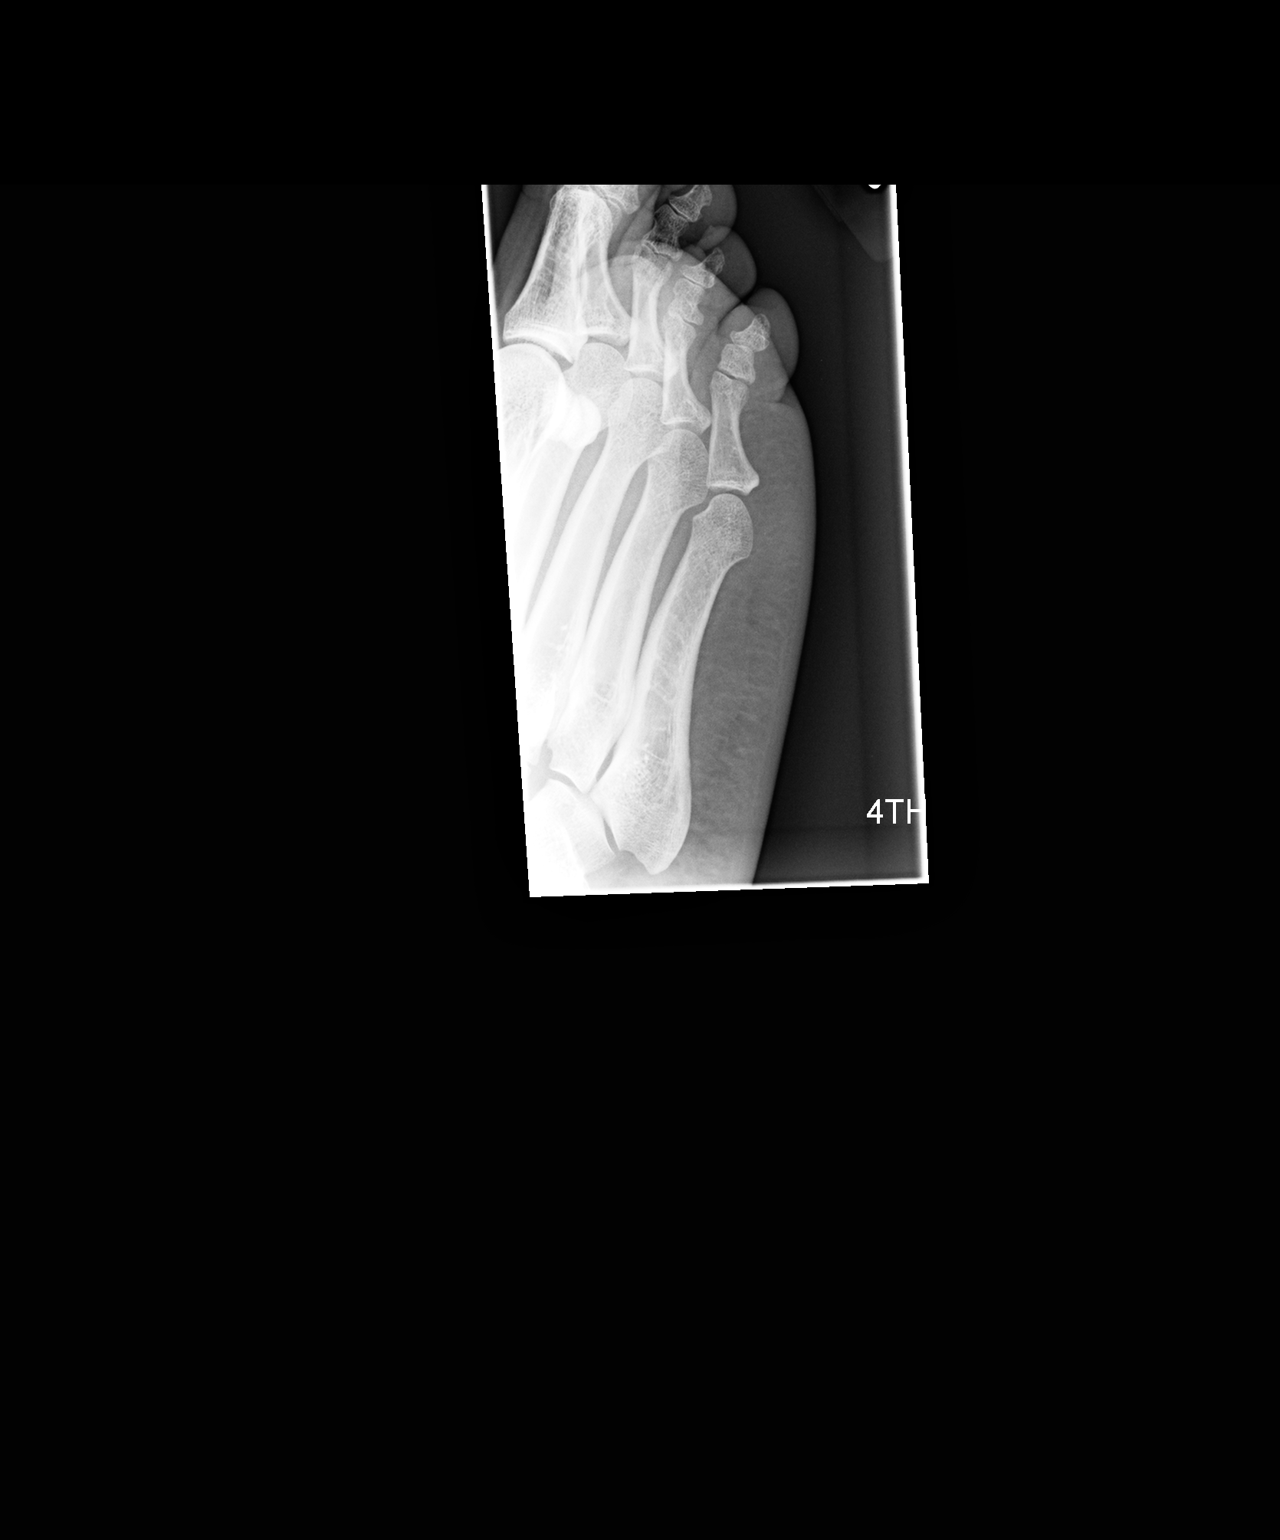

[3 of 3 positions shown; findings below may reference images not displayed]

FINDINGS: Oblique nondisplaced fracture right fourth proximal phalanx.
IMPRESSION: Oblique nondisplaced fracture right fourth proximal phalanx.

These results will be called to the ordering clinician or
representative by the Radiologist Assistant, and communication
documented in the PACS or zVision Dashboard.

## 2017-04-02 ENCOUNTER — Inpatient Hospital Stay (HOSPITAL_COMMUNITY): Payer: Self-pay

## 2017-04-02 ENCOUNTER — Inpatient Hospital Stay (HOSPITAL_COMMUNITY)
Admission: AD | Admit: 2017-04-02 | Discharge: 2017-04-02 | Disposition: A | Discharge status: Home / Self Care | Payer: Self-pay | Source: Ambulatory Visit | Attending: Obstetrics and Gynecology | Admitting: Obstetrics and Gynecology

## 2017-04-02 ENCOUNTER — Other Ambulatory Visit: Payer: Self-pay

## 2017-04-02 ENCOUNTER — Encounter (HOSPITAL_COMMUNITY): Payer: Self-pay | Admitting: *Deleted

## 2017-04-02 DIAGNOSIS — O3680X0 Pregnancy with inconclusive fetal viability, not applicable or unspecified: Secondary | ICD-10-CM | POA: Insufficient documentation

## 2017-04-02 DIAGNOSIS — Z975 Presence of (intrauterine) contraceptive device: Secondary | ICD-10-CM | POA: Insufficient documentation

## 2017-04-02 DIAGNOSIS — O26891 Other specified pregnancy related conditions, first trimester: Secondary | ICD-10-CM

## 2017-04-02 DIAGNOSIS — N83291 Other ovarian cyst, right side: Secondary | ICD-10-CM | POA: Insufficient documentation

## 2017-04-02 DIAGNOSIS — O3481 Maternal care for other abnormalities of pelvic organs, first trimester: Secondary | ICD-10-CM | POA: Insufficient documentation

## 2017-04-02 DIAGNOSIS — Z3A01 Less than 8 weeks gestation of pregnancy: Secondary | ICD-10-CM | POA: Insufficient documentation

## 2017-04-02 DIAGNOSIS — R109 Unspecified abdominal pain: Secondary | ICD-10-CM | POA: Insufficient documentation

## 2017-04-02 DIAGNOSIS — N898 Other specified noninflammatory disorders of vagina: Secondary | ICD-10-CM

## 2017-04-02 DIAGNOSIS — O26899 Other specified pregnancy related conditions, unspecified trimester: Secondary | ICD-10-CM

## 2017-04-02 DIAGNOSIS — Z87440 Personal history of urinary (tract) infections: Secondary | ICD-10-CM | POA: Insufficient documentation

## 2017-04-02 DIAGNOSIS — Z79899 Other long term (current) drug therapy: Secondary | ICD-10-CM | POA: Insufficient documentation

## 2017-04-02 DIAGNOSIS — Z87891 Personal history of nicotine dependence: Secondary | ICD-10-CM | POA: Insufficient documentation

## 2017-04-02 DIAGNOSIS — Z3201 Encounter for pregnancy test, result positive: Secondary | ICD-10-CM | POA: Insufficient documentation

## 2017-04-02 DIAGNOSIS — Z9889 Other specified postprocedural states: Secondary | ICD-10-CM | POA: Insufficient documentation

## 2017-04-02 HISTORY — DX: Unspecified infectious disease: B99.9

## 2017-04-02 LAB — CBC
HCT: 41.5 % (ref 36.0–46.0)
Hemoglobin: 14.1 g/dL (ref 12.0–15.0)
MCH: 33.7 pg (ref 26.0–34.0)
MCHC: 34 g/dL (ref 30.0–36.0)
MCV: 99.3 fL (ref 78.0–100.0)
Platelets: 215 10*3/uL (ref 150–400)
RBC: 4.18 MIL/uL (ref 3.87–5.11)
RDW: 12.4 % (ref 11.5–15.5)
WBC: 3.6 10*3/uL — ABNORMAL LOW (ref 4.0–10.5)

## 2017-04-02 LAB — WET PREP, GENITAL
Sperm: NONE SEEN
Trich, Wet Prep: NONE SEEN

## 2017-04-02 LAB — URINALYSIS, ROUTINE W REFLEX MICROSCOPIC
Bilirubin Urine: NEGATIVE
GLUCOSE, UA: NEGATIVE mg/dL
HGB URINE DIPSTICK: NEGATIVE
Ketones, ur: 5 mg/dL — AB
Leukocytes, UA: NEGATIVE
Nitrite: NEGATIVE
PH: 6 (ref 5.0–8.0)
Protein, ur: NEGATIVE mg/dL
SPECIFIC GRAVITY, URINE: 1.02 (ref 1.005–1.030)

## 2017-04-02 LAB — POCT PREGNANCY, URINE: Preg Test, Ur: POSITIVE — AB

## 2017-04-02 LAB — ABO/RH: ABO/RH(D): O POS

## 2017-04-02 LAB — HCG, QUANTITATIVE, PREGNANCY: hCG, Beta Chain, Quant, S: 237 m[IU]/mL — ABNORMAL HIGH (ref <5)

## 2017-04-02 MED ORDER — TERCONAZOLE 0.4 % VA CREA: 1.0000 | TOPICAL_CREAM | Freq: Every day | VAGINAL | 0 refills | Status: DC

## 2017-04-02 NOTE — MAU Provider Note (Signed)
Chief Complaint: Abdominal Pain and Possible Pregnancy   First Provider Initiated Contact with Patient 04/02/17 1525      SUBJECTIVE HPI: Cynthia Woodward is a 26 y.o. G1P0000 at [redacted]w[redacted]d by LMP who presents to maternity admissions reporting abdominal cramping and positive HPTx3. She reports that cramping has been intermittent for one week, she describes pain as cramping and pressure "like she was about to start her period". Has not tried any medication for pain. No abdominal pain or cramping currently.She denies vaginal bleeding, vaginal itching/burning, urinary symptoms, h/a, dizziness, n/v, or fever/chills.    Past Medical History:  Diagnosis Date  . Headache(784.0)   . Infection    UTI   Past Surgical History:  Procedure Laterality Date  . KNEE SURGERY Left    dislocated 2008, surgical repeat 2011  . wisdom teeth removal  2006   Social History   Socioeconomic History  . Marital status: Single    Spouse name: Not on file  . Number of children: Not on file  . Years of education: Not on file  . Highest education level: Not on file  Social Needs  . Financial resource strain: Not on file  . Food insecurity - worry: Not on file  . Food insecurity - inability: Not on file  . Transportation needs - medical: Not on file  . Transportation needs - non-medical: Not on file  Occupational History  . Not on file  Tobacco Use  . Smoking status: Former Smoker    Types: Cigars  . Smokeless tobacco: Never Used  . Tobacco comment: black and mild; quit at Christmas  Substance and Sexual Activity  . Alcohol use: Yes    Comment: occ.  . Drug use: No  . Sexual activity: Yes    Birth control/protection: None  Other Topics Concern  . Not on file  Social History Narrative  . Not on file   No current facility-administered medications on file prior to encounter.    Current Outpatient Medications on File Prior to Encounter  Medication Sig Dispense Refill  . ibuprofen (ADVIL,MOTRIN) 600 MG  tablet Take 1 tablet (600 mg total) by mouth every 6 (six) hours as needed. 30 tablet 0  . levonorgestrel (MIRENA) 20 MCG/24HR IUD 1 each by Intrauterine route once.     . naproxen sodium (ANAPROX) 220 MG tablet Take 440 mg by mouth 2 (two) times daily as needed (For headache). For pain    . predniSONE (DELTASONE) 20 MG tablet Take 2 tablets (40 mg total) by mouth daily. 10 tablet 0  . traMADol (ULTRAM) 50 MG tablet Take 1 tablet (50 mg total) by mouth every 6 (six) hours as needed. 15 tablet 0   No Known Allergies  ROS:  Review of Systems  Constitutional: Negative.   Respiratory: Negative.   Cardiovascular: Negative.   Gastrointestinal: Positive for abdominal pain. Negative for constipation, diarrhea, nausea and vomiting.  Genitourinary: Negative.   Musculoskeletal: Negative.   Neurological: Negative.   Psychiatric/Behavioral: Negative.    I have reviewed patient's Past Medical Hx, Surgical Hx, Family Hx, Social Hx, medications and allergies.   Physical Exam   Patient Vitals for the past 24 hrs:  BP Temp Temp src Pulse Resp SpO2 Weight  04/02/17 1304 135/74 99.1 F (37.3 C) Oral 87 16 100 % 193 lb 8 oz (87.8 kg)   Constitutional: Well-developed, well-nourished female in no acute distress.  Cardiovascular: normal rate Respiratory: normal effort GI: Abd soft, non-tender. Pos BS x 4 MS: Extremities nontender,  no edema, normal ROM Neurologic: Alert and oriented x 4.  GU: Neg CVAT.  PELVIC EXAM: Cervix pink, visually closed, without lesion, moderate white thick chunks of discharge around cervix, vaginal walls and external genitalia normal Bimanual exam: Cervix 0/long/high, firm, anterior, neg CMT, uterus nontender, nonenlarged, adnexa without tenderness, enlargement, or mass  LAB RESULTS Results for orders placed or performed during the hospital encounter of 04/02/17 (from the past 24 hour(s))  Urinalysis, Routine w reflex microscopic     Status: Abnormal   Collection Time:  04/02/17  1:07 PM  Result Value Ref Range   Color, Urine YELLOW YELLOW   APPearance CLEAR CLEAR   Specific Gravity, Urine 1.020 1.005 - 1.030   pH 6.0 5.0 - 8.0   Glucose, UA NEGATIVE NEGATIVE mg/dL   Hgb urine dipstick NEGATIVE NEGATIVE   Bilirubin Urine NEGATIVE NEGATIVE   Ketones, ur 5 (A) NEGATIVE mg/dL   Protein, ur NEGATIVE NEGATIVE mg/dL   Nitrite NEGATIVE NEGATIVE   Leukocytes, UA NEGATIVE NEGATIVE  Pregnancy, urine POC     Status: Abnormal   Collection Time: 04/02/17  1:23 PM  Result Value Ref Range   Preg Test, Ur POSITIVE (A) NEGATIVE  CBC     Status: Abnormal   Collection Time: 04/02/17  2:04 PM  Result Value Ref Range   WBC 3.6 (L) 4.0 - 10.5 K/uL   RBC 4.18 3.87 - 5.11 MIL/uL   Hemoglobin 14.1 12.0 - 15.0 g/dL   HCT 98.141.5 19.136.0 - 47.846.0 %   MCV 99.3 78.0 - 100.0 fL   MCH 33.7 26.0 - 34.0 pg   MCHC 34.0 30.0 - 36.0 g/dL   RDW 29.512.4 62.111.5 - 30.815.5 %   Platelets 215 150 - 400 K/uL  ABO/Rh     Status: None (Preliminary result)   Collection Time: 04/02/17  2:04 PM  Result Value Ref Range   ABO/RH(D) O POS   hCG, quantitative, pregnancy     Status: Abnormal   Collection Time: 04/02/17  2:04 PM  Result Value Ref Range   hCG, Beta Chain, Quant, S 237 (H) <5 mIU/mL  Wet prep, genital     Status: Abnormal   Collection Time: 04/02/17  3:25 PM  Result Value Ref Range   Yeast Wet Prep HPF POC PRESENT (A) NONE SEEN   Trich, Wet Prep NONE SEEN NONE SEEN   Clue Cells Wet Prep HPF POC PRESENT (A) NONE SEEN   WBC, Wet Prep HPF POC MODERATE (A) NONE SEEN   Sperm NONE SEEN     --/--/O POS (12/29 1404)  IMAGING [place] Ob Comp Less 14 Wks  Result Date: 04/02/2017 CLINICAL DATA:  Cramping with positive pregnancy test. EXAM: OBSTETRIC <14 WK US AND TRANSVAGINAL OB US TECHNIQUE: Both transabdominal and transvaginal ultrasound examinations were performed for complete evaluation of the gestation as well as the maternal uterus, adnexal regions, and pelvic cul-de-sac. Transvaginal technique  was performed to assess early pregnancy. COMPARISON:  None. FINDINGS: Intrauterine gestational sac: Not visualized. Yolk sac:  N/A Embryo:  N/A Subchorionic hemorrhage:  None visualized. Maternal uterus/adnexae: 18 mm complex cyst identified in the parenchyma of the right ovary. Left ovary sonographically normal. No substantial free fluid in the cul-de-sac. IMPRESSION: 1. No intrauterine gestation visualized. Given the history of a positive pregnancy test, differential considerations include intrauterine gestation too early to visualize, completed abortion, or nonvisualized ectopic pregnancy. 2. 18 mm complex cystic lesion identified in the parenchyma the right ovary. Intra ovarian ectopic pregnancy is uncommon and this  likely represents a corpus luteum cyst. Nonvisualized ectopic pregnancy has not been excluded. Electronically Signed   By: Kennith CenterEric  Mansell M.D.   On: 04/02/2017 15:24   [place] Ob Transvaginal  Result Date: 04/02/2017 CLINICAL DATA:  Cramping with positive pregnancy test. EXAM: OBSTETRIC <14 WK US AND TRANSVAGINAL OB US TECHNIQUE: Both transabdominal and transvaginal ultrasound examinations were performed for complete evaluation of the gestation as well as the maternal uterus, adnexal regions, and pelvic cul-de-sac. Transvaginal technique was performed to assess early pregnancy. COMPARISON:  None. FINDINGS: Intrauterine gestational sac: Not visualized. Yolk sac:  N/A Embryo:  N/A Subchorionic hemorrhage:  None visualized. Maternal uterus/adnexae: 18 mm complex cyst identified in the parenchyma of the right ovary. Left ovary sonographically normal. No substantial free fluid in the cul-de-sac. IMPRESSION: 1. No intrauterine gestation visualized. Given the history of a positive pregnancy test, differential considerations include intrauterine gestation too early to visualize, completed abortion, or nonvisualized ectopic pregnancy. 2. 18 mm complex cystic lesion identified in the parenchyma the right ovary.  Intra ovarian ectopic pregnancy is uncommon and this likely represents a corpus luteum cyst. Nonvisualized ectopic pregnancy has not been excluded. Electronically Signed   By: Kennith CenterEric  Mansell M.D.   On: 04/02/2017 15:24    MAU Management/MDM: Orders Placed This Encounter  Procedures  . Wet prep, genital  . US OB Transvaginal  . US OB Comp Less 14 Wks  . Urinalysis, Routine w reflex microscopic  . CBC  . hCG, quantitative, pregnancy  . HIV antibody (routine testing) ([facility])  . Pregnancy, urine POC  . ABO/Rh  Wet prep- shows BV and yeast, will treat for yeast. Offered terazole for treatment of yeast- patient agrees to POC   Meds ordered this encounter  Medications  . terconazole (TERAZOL 7) 0.4 % vaginal cream    Sig: Place 1 applicator vaginally at bedtime. Use for seven days    Dispense:  45 g    Refill:  0    Order Specific Question:   Supervising Provider    Answer:   [place] BingPICKENS, CHARLIE [[id number]]    Pt discharged with strict ectopic precautions and to return to MAU with increased abdominal pain. Educated on the discontinuation of Lexapro during pregnancy and switching to Zoloft- patient uses Lexapro for treatment of migraines not depression, not daily use- Has not taken medication in one month. Plans to continue Lexapro as needed for HA and switch to different medication at initial prenatal appointment.   ASSESSMENT 1. Pregnancy of unknown anatomic location   2. Cramping complicating pregnancy, antepartum   3. Vaginal discharge during pregnancy in first trimester   4. Abdominal pain during pregnancy in first trimester    PLAN Discharge home Return 48 hours to clinic for repeat HCG at 9am  Return to MAU as needed for emergencies and increased pain or bleeding  Rx for Terazol sent to pharmacy of choice     Allergies as of 04/02/2017   No Known Allergies     Medication List    TAKE these medications   escitalopram 10 MG tablet Commonly known as:  LEXAPRO Take 10  mg by mouth daily.   terconazole 0.4 % vaginal cream Commonly known as:  TERAZOL 7 Place 1 applicator vaginally at bedtime. Use for seven days       Steward DroneVeronica Kodiak Rollyson  Certified Nurse-Midwife 04/02/2017  3:39 PM

## 2017-04-02 NOTE — MAU Note (Signed)
Little cousin told her she is preg, has done 3 HPTs, they are all positive.  Is cramping like her cycle is going to start.

## 2017-04-03 LAB — HIV ANTIBODY (ROUTINE TESTING W REFLEX): HIV Screen 4th Generation wRfx: NONREACTIVE

## 2017-04-04 ENCOUNTER — Ambulatory Visit: Payer: Self-pay | Admitting: General Practice

## 2017-04-04 ENCOUNTER — Encounter: Payer: Self-pay | Admitting: Family Medicine

## 2017-04-04 DIAGNOSIS — O3680X0 Pregnancy with inconclusive fetal viability, not applicable or unspecified: Secondary | ICD-10-CM

## 2017-04-04 LAB — GC/CHLAMYDIA PROBE AMP (~~LOC~~) NOT AT ARMC
Chlamydia: NEGATIVE
Neisseria Gonorrhea: NEGATIVE

## 2017-04-04 LAB — HCG, QUANTITATIVE, PREGNANCY: hCG, Beta Chain, Quant, S: 497 m[IU]/mL — ABNORMAL HIGH (ref <5)

## 2017-04-04 NOTE — Progress Notes (Signed)
Reviewed hcg levels and agree w/ POC.  Katrinka BlazingSmith, [place], CNM 04/04/2017 1:13 PM

## 2017-04-04 NOTE — Progress Notes (Signed)
Patient here for stat bhcg today. Patient denies pain or bleeding. Discussed with patient that in most normal progressing pregnancies bhcg levels double every 48 hours and discussed her waiting in lobby for results & updated plan of care. Patient verbalized understanding to all and has no questions at this time.  Reviewed results with Dr Vergie LivingPickens as Dorathy KinsmanVirginia Smith was unavailable at the time. He finds increasing bhcg levels favorable- patient should have ultrasound in 2 weeks. Scheduled for 1/14 @ 8am.  Informed patient of results & ultrasound appt. Patient verbalized understanding to all & had no questions

## 2017-04-05 NOTE — L&D Delivery Note (Signed)
Operative Delivery Note Patient was pushing for less than 20 minutes and there was an episode of fetal bradycardia Discussed with patient operative delivery with a vacuum. Verbal consent: obtained from patient.  Risks and benefits discussed in detail.  Risks include, but are not limited to the risks of anesthesia, bleeding, infection, damage to maternal tissues, fetal cephalhematoma.  There is also the risk of inability to effect vaginal delivery of the head, or shoulder dystocia that cannot be resolved by established maneuvers, leading to the need for emergency cesarean section.  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 Bladder was just previously emptied by removal of foley. Epidural was found to be adequate.  Vertex was +3 station.  The Kiwi vacuum was placed.  With maternal effort and 2 pulls:  At 11:43 AM a viable female was delivered via Vaginal, Spontaneous.  Presentation: vertex; Position: Occiput,, Anterior; Station: +4. shoulders and body were easily delivered. Cord was double clamped and cut and the infant handed to the waiting Pediatricians.  Infant was noted to be moving all four extremities and crying.  Cord gases and cord blood was obtained. The placenta delivered spontaneously intact 3 vessels noted.     Uterine atony was present and with IV pitocin, massage and one dose of IM methergine it was alleviated.    The 1st degree vaginal tear and right labial lac was repaired in routine fashion. The patient tolerated procedure well.    APGAR: 3, 6; 8 weight 7 lb 1.2 oz (3210 g).   Placenta status:intact 3 vessels  Cord pH pending  Anesthesia: Epidural / 1% lidocaine  Instruments: Kiwi Vacuum Episiotomy: None Lacerations: 1st degree;Labial Suture Repair: 2.0 vicryl and 3-0 chromic Est. Blood Loss (mL):  300 cc  Mom to postpartum.  Baby to Couplet care / Skin to Skin.  Essie HartINN, Shacora Zynda STACIA 11/25/2017, 12:33 PM

## 2017-04-14 DIAGNOSIS — Z3491 Encounter for supervision of normal pregnancy, unspecified, first trimester: Secondary | ICD-10-CM | POA: Diagnosis not present

## 2017-04-14 DIAGNOSIS — Z113 Encounter for screening for infections with a predominantly sexual mode of transmission: Secondary | ICD-10-CM | POA: Diagnosis not present

## 2017-04-14 LAB — OB RESULTS CONSOLE HEPATITIS B SURFACE ANTIGEN: Hepatitis B Surface Ag: NEGATIVE

## 2017-04-14 LAB — OB RESULTS CONSOLE ANTIBODY SCREEN: Antibody Screen: NEGATIVE

## 2017-04-14 LAB — OB RESULTS CONSOLE RPR: RPR: NONREACTIVE

## 2017-04-14 LAB — OB RESULTS CONSOLE RUBELLA ANTIBODY, IGM: Rubella: IMMUNE

## 2017-04-14 LAB — OB RESULTS CONSOLE HIV ANTIBODY (ROUTINE TESTING): HIV: NONREACTIVE

## 2017-04-14 LAB — OB RESULTS CONSOLE GC/CHLAMYDIA: Gonorrhea: NEGATIVE

## 2017-04-18 ENCOUNTER — Ambulatory Visit: Payer: Self-pay

## 2017-04-18 ENCOUNTER — Ambulatory Visit (HOSPITAL_COMMUNITY): Payer: Self-pay

## 2017-05-18 DIAGNOSIS — D72819 Decreased white blood cell count, unspecified: Secondary | ICD-10-CM | POA: Diagnosis not present

## 2017-05-18 DIAGNOSIS — Z124 Encounter for screening for malignant neoplasm of cervix: Secondary | ICD-10-CM | POA: Diagnosis not present

## 2017-05-18 DIAGNOSIS — Z3491 Encounter for supervision of normal pregnancy, unspecified, first trimester: Secondary | ICD-10-CM | POA: Diagnosis not present

## 2017-05-18 DIAGNOSIS — O3680X9 Pregnancy with inconclusive fetal viability, other fetus: Secondary | ICD-10-CM | POA: Diagnosis not present

## 2017-05-18 DIAGNOSIS — O09511 Supervision of elderly primigravida, first trimester: Secondary | ICD-10-CM | POA: Diagnosis not present

## 2017-05-18 DIAGNOSIS — Z3A10 10 weeks gestation of pregnancy: Secondary | ICD-10-CM | POA: Diagnosis not present

## 2017-05-18 DIAGNOSIS — N898 Other specified noninflammatory disorders of vagina: Secondary | ICD-10-CM | POA: Diagnosis not present

## 2017-06-07 ENCOUNTER — Emergency Department (HOSPITAL_COMMUNITY)
Admission: EM | Admit: 2017-06-07 | Discharge: 2017-06-07 | Disposition: A | Discharge status: Home / Self Care | Payer: Medicaid Other | Attending: Emergency Medicine | Admitting: Emergency Medicine

## 2017-06-07 ENCOUNTER — Inpatient Hospital Stay (HOSPITAL_COMMUNITY)
Admission: AD | Admit: 2017-06-07 | Discharge: 2017-06-07 | Disposition: A | Discharge status: Home / Self Care | Payer: Medicaid Other | Source: Ambulatory Visit | Attending: Obstetrics and Gynecology | Admitting: Obstetrics and Gynecology

## 2017-06-07 ENCOUNTER — Encounter (HOSPITAL_COMMUNITY): Payer: Self-pay

## 2017-06-07 ENCOUNTER — Encounter (HOSPITAL_COMMUNITY): Payer: Self-pay | Admitting: Emergency Medicine

## 2017-06-07 DIAGNOSIS — O218 Other vomiting complicating pregnancy: Secondary | ICD-10-CM | POA: Diagnosis not present

## 2017-06-07 DIAGNOSIS — Z5321 Procedure and treatment not carried out due to patient leaving prior to being seen by health care provider: Secondary | ICD-10-CM | POA: Diagnosis not present

## 2017-06-07 DIAGNOSIS — Z3A13 13 weeks gestation of pregnancy: Secondary | ICD-10-CM | POA: Diagnosis not present

## 2017-06-07 DIAGNOSIS — R112 Nausea with vomiting, unspecified: Secondary | ICD-10-CM | POA: Diagnosis not present

## 2017-06-07 DIAGNOSIS — O219 Vomiting of pregnancy, unspecified: Secondary | ICD-10-CM

## 2017-06-07 LAB — URINALYSIS, ROUTINE W REFLEX MICROSCOPIC
Bilirubin Urine: NEGATIVE
Glucose, UA: NEGATIVE mg/dL
Hgb urine dipstick: NEGATIVE
Ketones, ur: 80 mg/dL — AB
Leukocytes, UA: NEGATIVE
Nitrite: NEGATIVE
Protein, ur: 30 mg/dL — AB
Specific Gravity, Urine: 1.025 (ref 1.005–1.030)
pH: 5 (ref 5.0–8.0)

## 2017-06-07 LAB — COMPREHENSIVE METABOLIC PANEL
ALBUMIN: 3.7 g/dL (ref 3.5–5.0)
ALT: 17 U/L (ref 14–54)
AST: 21 U/L (ref 15–41)
Alkaline Phosphatase: 44 U/L (ref 38–126)
Anion gap: 10 (ref 5–15)
BUN: 7 mg/dL (ref 6–20)
CO2: 21 mmol/L — AB (ref 22–32)
Calcium: 9.3 mg/dL (ref 8.9–10.3)
Chloride: 106 mmol/L (ref 101–111)
Creatinine, Ser: 0.62 mg/dL (ref 0.44–1.00)
GFR calc Af Amer: >60 mL/min (ref >60)
GFR calc non Af Amer: >60 mL/min (ref >60)
GLUCOSE: 97 mg/dL (ref 65–99)
POTASSIUM: 3.7 mmol/L (ref 3.5–5.1)
SODIUM: 137 mmol/L (ref 135–145)
Total Bilirubin: 0.7 mg/dL (ref 0.3–1.2)
Total Protein: 7.4 g/dL (ref 6.5–8.1)

## 2017-06-07 LAB — CBC
HEMATOCRIT: 43.2 % (ref 36.0–46.0)
Hemoglobin: 14.8 g/dL (ref 12.0–15.0)
MCH: 33.7 pg (ref 26.0–34.0)
MCHC: 34.3 g/dL (ref 30.0–36.0)
MCV: 98.4 fL (ref 78.0–100.0)
Platelets: 230 10*3/uL (ref 150–400)
RBC: 4.39 MIL/uL (ref 3.87–5.11)
RDW: 12.3 % (ref 11.5–15.5)
WBC: 7.1 10*3/uL (ref 4.0–10.5)

## 2017-06-07 LAB — INFLUENZA PANEL BY PCR (TYPE A & B)
INFLAPCR: NEGATIVE
Influenza B By PCR: NEGATIVE

## 2017-06-07 LAB — LIPASE, BLOOD: LIPASE: 60 U/L — AB (ref 11–51)

## 2017-06-07 MED ORDER — LACTATED RINGERS IV BOLUS (SEPSIS)
1000.0000 mL | Freq: Once | INTRAVENOUS | Status: AC
  Administered 2017-06-07: 1000 mL via INTRAVENOUS

## 2017-06-07 MED ORDER — ONDANSETRON HCL 4 MG/2ML IJ SOLN
4.0000 mg | Freq: Once | INTRAMUSCULAR | Status: AC
  Administered 2017-06-07: 4 mg via INTRAVENOUS
  Filled 2017-06-07: qty 2

## 2017-06-07 MED ORDER — ONDANSETRON HCL 4 MG PO TABS: 4.0000 mg | ORAL_TABLET | Freq: Every day | ORAL | 0 refills | Status: DC | PRN

## 2017-06-07 MED ORDER — LACTATED RINGERS IV BOLUS (SEPSIS): 1000.0000 mL | Freq: Once | INTRAVENOUS | Status: DC

## 2017-06-07 NOTE — ED Triage Notes (Signed)
Pt reporting upper abdominal pain while vomiting.

## 2017-06-07 NOTE — MAU Note (Addendum)
G1 @ 13.[redacted] wksga. Presents to triage for flu-like symptoms. N/V/D and chills Denies bleeding or LOF.   Provider notified. Made aware of urine results. Ordered to place IV and LR bolus.   1947: V started. See flow sheet for details  Ice chips provided  2135: drink and crackers provided. Pt tolerated it well.   2207:rovider notified of flu negative. D/c orders received.   D/c instructions given with pt understanding. Pt left unit via ambulatory with family.

## 2017-06-07 NOTE — MAU Note (Signed)
Pt reports she started vomiting at 1pm today, having chills. Pt reports 2 watery stools since 1 pm

## 2017-06-07 NOTE — Discharge Instructions (Signed)
Nausea and Vomiting, Adult Feeling sick to your stomach (nausea) means that your stomach is upset or you feel like you have to throw up (vomit). Feeling more and more sick to your stomach can lead to throwing up. Throwing up happens when food and liquid from your stomach are thrown up and out the mouth. Throwing up can make you feel weak and cause you to get dehydrated. Dehydration can make you tired and thirsty, make you have a dry mouth, and make it so you pee (urinate) less often. Older adults and people with other diseases or a weak defense system (immune system) are at higher risk for dehydration. If you feel sick to your stomach or if you throw up, it is important to follow instructions from your doctor about how to take care of yourself. Follow these instructions at home: Eating and drinking Follow these instructions as told by your doctor:  Take an oral rehydration solution (ORS). This is a drink that is sold at pharmacies and stores.  Drink clear fluids in small amounts as you are able, such as: ? Water. ? Ice chips. ? Diluted fruit juice. ? Low-calorie sports drinks.  Eat bland, easy-to-digest foods in small amounts as you are able, such as: ? Bananas. ? Applesauce. ? Rice. ? Low-fat (lean) meats. ? Toast. ? Crackers.  Avoid fluids that have a lot of sugar or caffeine in them.  Avoid alcohol.  Avoid spicy or fatty foods.  General instructions  Drink enough fluid to keep your pee (urine) clear or pale yellow.  Wash your hands often. If you cannot use soap and water, use hand sanitizer.  Make sure that all people in your home wash their hands well and often.  Take over-the-counter and prescription medicines only as told by your doctor.  Rest at home while you get better.  Watch your condition for any changes.  Breathe slowly and deeply when you feel sick to your stomach.  Keep all follow-up visits as told by your doctor. This is important. Contact a doctor  if:  You have a fever.  You cannot keep fluids down.  Your symptoms get worse.  You have new symptoms.  You feel sick to your stomach for more than two days.  You feel light-headed or dizzy.  You have a headache.  You have muscle cramps. Get help right away if:  You have pain in your chest, neck, arm, or jaw.  You feel very weak or you pass out (faint).  You throw up again and again.  You see blood in your throw-up.  Your throw-up looks like black coffee grounds.  You have bloody or black poop (stools) or poop that look like tar.  You have a very bad headache, a stiff neck, or both.  You have a rash.  You have very bad pain, cramping, or bloating in your belly (abdomen).  You have trouble breathing.  You are breathing very quickly.  Your heart is beating very quickly.  Your skin feels cold and clammy.  You feel confused.  You have pain when you pee.  You have signs of dehydration, such as: ? Dark pee, hardly any pee, or no pee. ? Cracked lips. ? Dry mouth. ? Sunken eyes. ? Sleepiness. ? Weakness. These symptoms may be an emergency. Do not wait to see if the symptoms will go away. Get medical help right away. Call your local emergency services (911 in the [place] Do not drive yourself to the hospital. This information is   not intended to replace advice given to you by your health care provider. Make sure you discuss any questions you have with your health care provider. Document Released: 09/08/2007 Document Revised: 10/10/2015 Document Reviewed: 11/26/2014 Elsevier Interactive Patient Education  2018 Elsevier Inc.  

## 2017-06-07 NOTE — MAU Provider Note (Signed)
Chief Complaint: Emesis; Nausea; Diarrhea; and Chills   None    SUBJECTIVE HPI: Cynthia Woodward is a 27 y.o. G1P0000 at [redacted]w[redacted]d who presents to M[facility] Admissions reporting nausea, vomiting and diarrhea.  Denies family members being sick.  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  Had flu shot in Oct.  Denies vaginal discharge or bleeding  Location: stomach pain Quality: vomited 6 times Severity: 4/10 on pain scale Duration: one day  Modifying factors: none Associated signs and symptoms: chills  Past Medical History:  Diagnosis Date  . Headache(784.0)    chronic   . Infection    UTI   OB History  Gravida Para Term Preterm AB Living  1 0 0 0 0 0  SAB TAB Ectopic Multiple Live Births  0 0 0 0 0    # Outcome Date GA Lbr Len/2nd Weight Sex Delivery Anes PTL Lv  1 Current              Past Surgical History:  Procedure Laterality Date  . KNEE SURGERY Left    dislocated 2008, surgical repeat 2011  . wisdom teeth removal  2006   Social History   Socioeconomic History  . Marital status: Single    Spouse name: Not on file  . Number of children: Not on file  . Years of education: Not on file  . Highest education level: Not on file  Social Needs  . Financial resource strain: Not on file  . Food insecurity - worry: Not on file  . Food insecurity - inability: Not on file  . Transportation needs - medical: Not on file  . Transportation needs - non-medical: Not on file  Occupational History  . Not on file  Tobacco Use  . Smoking status: Former Smoker    Types: Cigars  . Smokeless tobacco: Never Used  . Tobacco comment: black and mild; quit at Christmas  Substance and Sexual Activity  . Alcohol use: Yes    Comment: occ.  . Drug use: No  . Sexual activity: Yes    Birth control/protection: None  Other Topics Concern  . Not on file  Social History Narrative  . Not on file   Family History  Problem Relation Age of Onset  . Heart disease Mother        CHF  .  Hyperthyroidism Mother   . Hypertension Mother   . Asthma Father   . Lupus Paternal Grandmother   . Cancer Paternal Grandfather    No current facility-administered medications on file prior to encounter.    Current Outpatient Medications on File Prior to Encounter  Medication Sig Dispense Refill  . escitalopram (LEXAPRO) 10 MG tablet Take 10 mg by mouth daily.    Marland Kitchen terconazole (TERAZOL 7) 0.4 % vaginal cream Place 1 applicator vaginally at bedtime. Use for seven days 45 g 0   No Known Allergies  I have reviewed patient's Past Medical Hx, Surgical Hx, Family Hx, Social Hx, medications and allergies.   Review of Systems  Constitutional: Negative.   HENT: Negative.   Eyes: Negative.   Respiratory: Negative.   Cardiovascular: Negative.   Gastrointestinal: Positive for abdominal pain, diarrhea, nausea and vomiting.  Endocrine: Negative.   Genitourinary: Negative.   Musculoskeletal: Negative.   Allergic/Immunologic: Negative.   Neurological: Negative.   Hematological: Negative.   Psychiatric/Behavioral: Negative.     OBJECTIVE Patient Vitals for the past 24 hrs:  BP Temp Temp src Pulse Resp SpO2 Height Weight  06/07/17  1950 - 99.2 F (37.3 C) Oral - - - - -  06/07/17 1819 129/74 99 F (37.2 C) Oral 87 15 100 % 5\' 5"  (1.651 m) 85.3 kg (188 lb)   Constitutional: Well-developed, well-nourished female in no acute distress.  Cardiovascular: normal rate Respiratory: normal rate and effort.  GI: Abd soft, non-tender,  Pos BS x 4 MS: Extremities nontender, no edema, normal ROM Neurologic: Alert and oriented x 4.   LAB RESULTS Results for orders placed or performed during the hospital encounter of 06/07/17 (from the past 24 hour(s))  Urinalysis, Routine w reflex microscopic     Status: Abnormal   Collection Time: 06/07/17  6:15 PM  Result Value Ref Range   Color, Urine YELLOW YELLOW   APPearance HAZY (A) CLEAR   Specific Gravity, Urine 1.025 1.005 - 1.030   pH 5.0 5.0 - 8.0    Glucose, UA NEGATIVE NEGATIVE mg/dL   Hgb urine dipstick NEGATIVE NEGATIVE   Bilirubin Urine NEGATIVE NEGATIVE   Ketones, ur 80 (A) NEGATIVE mg/dL   Protein, ur 30 (A) NEGATIVE mg/dL   Nitrite NEGATIVE NEGATIVE   Leukocytes, UA NEGATIVE NEGATIVE   RBC / HPF 0-5 0 - 5 RBC/hpf   WBC, UA 0-5 0 - 5 WBC/hpf   Bacteria, UA RARE (A) NONE SEEN   Squamous Epithelial / LPF 0-5 (A) NONE SEEN   Mucus PRESENT     IMAGING No results found.  MAU COURSE Orders Placed This Encounter  Procedures  . Urinalysis, Routine w reflex microscopic  . Influenza panel by PCR (type A & B)  . Droplet precaution  . Insert peripheral IV   Meds ordered this encounter  Medications  . lactated ringers bolus 1,000 mL  . ondansetron (ZOFRAN) injection 4 mg    MDM PE and labs reviewed.  IV hydration and Zofran given.  Will do flu swab.  Will discharge once flu swab back.  Flu swab negative. ASSESSMENT Nausea and vomiting in pregnancy  PLAN Discharge home in stable condition.  Brat diet and Zofran if needed.  Follow up in office at next visit.  If illness continues more than 48 hours call office.      Kenney Housemanrothero, Nancy Jean, CNM 06/07/2017  8:45 PM

## 2017-06-07 NOTE — ED Triage Notes (Signed)
Pt reports that at approx 1300 she started having episodes of nausea, vomiting, and an episode of soft stools. Pt is [redacted] weeks pregnant.

## 2017-06-15 DIAGNOSIS — G43909 Migraine, unspecified, not intractable, without status migrainosus: Secondary | ICD-10-CM | POA: Diagnosis not present

## 2017-06-15 DIAGNOSIS — Z3A14 14 weeks gestation of pregnancy: Secondary | ICD-10-CM | POA: Diagnosis not present

## 2017-07-21 DIAGNOSIS — Z363 Encounter for antenatal screening for malformations: Secondary | ICD-10-CM | POA: Diagnosis not present

## 2017-07-21 DIAGNOSIS — Z3492 Encounter for supervision of normal pregnancy, unspecified, second trimester: Secondary | ICD-10-CM | POA: Diagnosis not present

## 2017-07-21 DIAGNOSIS — Z3A19 19 weeks gestation of pregnancy: Secondary | ICD-10-CM | POA: Diagnosis not present

## 2017-08-23 DIAGNOSIS — Z363 Encounter for antenatal screening for malformations: Secondary | ICD-10-CM | POA: Diagnosis not present

## 2017-08-23 DIAGNOSIS — Z362 Encounter for other antenatal screening follow-up: Secondary | ICD-10-CM | POA: Diagnosis not present

## 2017-08-23 DIAGNOSIS — Z3A24 24 weeks gestation of pregnancy: Secondary | ICD-10-CM | POA: Diagnosis not present

## 2017-09-12 DIAGNOSIS — Z369 Encounter for antenatal screening, unspecified: Secondary | ICD-10-CM | POA: Diagnosis not present

## 2017-09-12 DIAGNOSIS — Z3A27 27 weeks gestation of pregnancy: Secondary | ICD-10-CM | POA: Diagnosis not present

## 2017-09-12 DIAGNOSIS — Z23 Encounter for immunization: Secondary | ICD-10-CM | POA: Diagnosis not present

## 2017-09-12 DIAGNOSIS — O43129 Velamentous insertion of umbilical cord, unspecified trimester: Secondary | ICD-10-CM | POA: Diagnosis not present

## 2017-09-27 ENCOUNTER — Other Ambulatory Visit: Payer: Self-pay

## 2017-09-27 ENCOUNTER — Inpatient Hospital Stay (HOSPITAL_COMMUNITY)
Admission: AD | Admit: 2017-09-27 | Discharge: 2017-09-27 | Disposition: A | Discharge status: Home / Self Care | Payer: BLUE CROSS/BLUE SHIELD | Source: Ambulatory Visit | Attending: Obstetrics and Gynecology | Admitting: Obstetrics and Gynecology

## 2017-09-27 ENCOUNTER — Encounter (HOSPITAL_COMMUNITY): Payer: Self-pay

## 2017-09-27 DIAGNOSIS — M25512 Pain in left shoulder: Secondary | ICD-10-CM | POA: Diagnosis not present

## 2017-09-27 DIAGNOSIS — M25511 Pain in right shoulder: Secondary | ICD-10-CM | POA: Insufficient documentation

## 2017-09-27 DIAGNOSIS — Z87891 Personal history of nicotine dependence: Secondary | ICD-10-CM | POA: Insufficient documentation

## 2017-09-27 DIAGNOSIS — M25531 Pain in right wrist: Secondary | ICD-10-CM | POA: Insufficient documentation

## 2017-09-27 DIAGNOSIS — Z9889 Other specified postprocedural states: Secondary | ICD-10-CM | POA: Insufficient documentation

## 2017-09-27 DIAGNOSIS — Z8349 Family history of other endocrine, nutritional and metabolic diseases: Secondary | ICD-10-CM | POA: Diagnosis not present

## 2017-09-27 DIAGNOSIS — Z825 Family history of asthma and other chronic lower respiratory diseases: Secondary | ICD-10-CM | POA: Diagnosis not present

## 2017-09-27 DIAGNOSIS — Z809 Family history of malignant neoplasm, unspecified: Secondary | ICD-10-CM | POA: Insufficient documentation

## 2017-09-27 DIAGNOSIS — Z8249 Family history of ischemic heart disease and other diseases of the circulatory system: Secondary | ICD-10-CM | POA: Diagnosis not present

## 2017-09-27 DIAGNOSIS — Z3A29 29 weeks gestation of pregnancy: Secondary | ICD-10-CM | POA: Diagnosis not present

## 2017-09-27 MED ORDER — CYCLOBENZAPRINE HCL 10 MG PO TABS
10.0000 mg | ORAL_TABLET | Freq: Once | ORAL | Status: DC
  Filled 2017-09-27: qty 1

## 2017-09-27 NOTE — MAU Provider Note (Signed)
History     CSN: [id number]  Arrival date and time: 09/27/17 1743   None     Chief Complaint  Patient presents with  . Shoulder Pain  . Wrist Pain   Patient has been having right wrist pain and bilateral shoulder pain for several weeks. Wrist pain is being managed with brace and tylenol. Shoulder pain is worse in left shoulder than right and left shoulder pain is 8-10/10 most nights. Patient is taking extra strength tylenol without relief and is unable to sleep. She has not taken anything for sleep. Patient desires medication to allow her pain relief so she can sleep.    OB History    Gravida  1   Para  0   Term  0   Preterm  0   AB  0   Living  0     SAB  0   TAB  0   Ectopic  0   Multiple  0   Live Births  0           Past Medical History:  Diagnosis Date  . Headache(784.0)    chronic   . Infection    UTI    Past Surgical History:  Procedure Laterality Date  . KNEE SURGERY Left    dislocated 2008, surgical repeat 2011  . wisdom teeth removal  2006    Family History  Problem Relation Age of Onset  . Heart disease Mother        CHF  . Hyperthyroidism Mother   . Hypertension Mother   . Asthma Father   . Lupus Paternal Grandmother   . Cancer Paternal Grandfather     Social History   Tobacco Use  . Smoking status: Former Smoker    Types: Cigars  . Smokeless tobacco: Never Used  . Tobacco comment: black and mild; quit at Christmas  Substance Use Topics  . Alcohol use: Not Currently    Comment: occ.  . Drug use: No    Allergies: No Known Allergies  No medications prior to admission.    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  Physical Exam   Vitals:   09/27/17 1815 09/27/17 2226  BP: 123/67 139/74  Pulse: 99 (!) 105  Resp: 16   Temp: 98.8 F (37.1 C)   SpO2: 100%     Physical  Exam  Nursing note and vitals reviewed. Constitutional: She is oriented to person, place, and time. She appears well-developed and well-nourished.  HENT:  Head: Normocephalic.  Eyes: Pupils are equal, round, and reactive to light.  Cardiovascular: Normal rate, regular rhythm and normal heart sounds.  Respiratory: Effort normal and breath sounds normal.  Genitourinary: Vagina normal and uterus normal.  Musculoskeletal: She exhibits tenderness.       Right shoulder: She exhibits tenderness. She exhibits normal range of motion, no crepitus and no deformity.       Left shoulder: She exhibits tenderness. She exhibits normal range of motion, no crepitus and no deformity.       Right wrist: She exhibits tenderness.  Neurological: She is alert and oriented to person, place, and time.    MAU Course  MDM Patient only having pain in specific joints, unlikely to be arthritis related. Suspect neuropathy related to pregnancy changes. Patient started with right wrist carpal tunnel and progressed  to have bilateral shoulder pain. Normal ROM in both shoulders but patient avoids full motion of left shoulder due to pain, no crepitus, no deformity, no injury to either shoulder. Discussed patient with Dr. Normand Sloop, will rx Flexeril 10mg  TID PRN shoulder pain.   Assessment and Plan  27 y.o. G1P0 at [redacted]w[redacted]d FHR audible by doppler Bilateral shoulder pain  Rx Flexeril 10mg  PO TID PRN shoulder pain Discharge home with information on shoulder pain and nonpharmacologic management techniques   Cynthia Woodward 09/28/2017, 12:00 AM

## 2017-09-27 NOTE — Discharge Instructions (Signed)
Shoulder Pain Many things can cause shoulder pain, including:  An injury.  Moving the arm in the same way again and again (overuse).  Joint pain (arthritis).  Follow these instructions at home: Take these actions to help with your pain:  Squeeze a soft ball or a foam pad as much as you can. This helps to prevent swelling. It also makes the arm stronger.  Take over-the-counter and prescription medicines only as told by your doctor.  If told, put ice on the area: ? Put ice in a plastic bag. ? Place a towel between your skin and the bag. ? Leave the ice on for 20 minutes, 2-3 times per day. Stop putting on ice if it does not help with the pain.  If you were given a shoulder sling or immobilizer: ? Wear it as told. ? Remove it to shower or bathe. ? Move your arm as little as possible. ? Keep your hand moving. This helps prevent swelling.  Contact a doctor if:  Your pain gets worse.  Medicine does not help your pain.  You have new pain in your arm, hand, or fingers. Get help right away if:  Your arm, hand, or fingers: ? Tingle. ? Are numb. ? Are swollen. ? Are painful. ? Turn white or blue. This information is not intended to replace advice given to you by your health care provider. Make sure you discuss any questions you have with your health care provider. Document Released: 09/08/2007 Document Revised: 11/16/2015 Document Reviewed: 07/15/2014 Elsevier Interactive Patient Education  2018 Elsevier Inc.  

## 2017-09-27 NOTE — MAU Note (Signed)
Urine sent to lab 

## 2017-09-27 NOTE — MAU Note (Signed)
Has been having really really severe pain in shoulders and wrist pain for over a month.  Mentions it at appointments.  Nothing is being done. Worse at night. Can't deal with the really really intense pain at night, it is excruciating.  Wearing the wrist braces, nothing is helping.

## 2017-09-30 DIAGNOSIS — Z3A30 30 weeks gestation of pregnancy: Secondary | ICD-10-CM | POA: Diagnosis not present

## 2017-09-30 DIAGNOSIS — M25519 Pain in unspecified shoulder: Secondary | ICD-10-CM | POA: Diagnosis not present

## 2017-10-12 DIAGNOSIS — M25512 Pain in left shoulder: Secondary | ICD-10-CM | POA: Diagnosis not present

## 2017-10-12 DIAGNOSIS — M25531 Pain in right wrist: Secondary | ICD-10-CM | POA: Diagnosis not present

## 2017-10-12 DIAGNOSIS — O43123 Velamentous insertion of umbilical cord, third trimester: Secondary | ICD-10-CM | POA: Diagnosis not present

## 2017-10-12 DIAGNOSIS — Z3A31 31 weeks gestation of pregnancy: Secondary | ICD-10-CM | POA: Diagnosis not present

## 2017-10-28 DIAGNOSIS — L293 Anogenital pruritus, unspecified: Secondary | ICD-10-CM | POA: Diagnosis not present

## 2017-11-11 DIAGNOSIS — O1493 Unspecified pre-eclampsia, third trimester: Secondary | ICD-10-CM | POA: Diagnosis not present

## 2017-11-11 DIAGNOSIS — Z3A36 36 weeks gestation of pregnancy: Secondary | ICD-10-CM | POA: Diagnosis not present

## 2017-11-11 DIAGNOSIS — O43129 Velamentous insertion of umbilical cord, unspecified trimester: Secondary | ICD-10-CM | POA: Diagnosis not present

## 2017-11-11 DIAGNOSIS — Z3686 Encounter for antenatal screening for cervical length: Secondary | ICD-10-CM | POA: Diagnosis not present

## 2017-11-11 LAB — OB RESULTS CONSOLE GBS: STREP GROUP B AG: NEGATIVE

## 2017-11-21 DIAGNOSIS — Z3A37 37 weeks gestation of pregnancy: Secondary | ICD-10-CM | POA: Diagnosis not present

## 2017-11-21 DIAGNOSIS — Z3493 Encounter for supervision of normal pregnancy, unspecified, third trimester: Secondary | ICD-10-CM | POA: Diagnosis not present

## 2017-11-24 ENCOUNTER — Encounter (HOSPITAL_COMMUNITY): Payer: Self-pay

## 2017-11-24 ENCOUNTER — Other Ambulatory Visit: Payer: Self-pay

## 2017-11-24 ENCOUNTER — Inpatient Hospital Stay (HOSPITAL_COMMUNITY)
Admission: AC | Admit: 2017-11-24 | Discharge: 2017-11-27 | DRG: 806 | Disposition: A | Discharge status: Home / Self Care | Payer: BLUE CROSS/BLUE SHIELD | Attending: Obstetrics & Gynecology | Admitting: Obstetrics & Gynecology

## 2017-11-24 ENCOUNTER — Inpatient Hospital Stay (HOSPITAL_COMMUNITY): Payer: BLUE CROSS/BLUE SHIELD | Admitting: Anesthesiology

## 2017-11-24 DIAGNOSIS — Z87891 Personal history of nicotine dependence: Secondary | ICD-10-CM | POA: Diagnosis not present

## 2017-11-24 DIAGNOSIS — Z23 Encounter for immunization: Secondary | ICD-10-CM | POA: Diagnosis not present

## 2017-11-24 DIAGNOSIS — A6000 Herpesviral infection of urogenital system, unspecified: Secondary | ICD-10-CM | POA: Diagnosis present

## 2017-11-24 DIAGNOSIS — Z3483 Encounter for supervision of other normal pregnancy, third trimester: Secondary | ICD-10-CM | POA: Diagnosis not present

## 2017-11-24 DIAGNOSIS — O43123 Velamentous insertion of umbilical cord, third trimester: Secondary | ICD-10-CM | POA: Diagnosis not present

## 2017-11-24 DIAGNOSIS — O99214 Obesity complicating childbirth: Secondary | ICD-10-CM | POA: Diagnosis present

## 2017-11-24 DIAGNOSIS — Z3A37 37 weeks gestation of pregnancy: Secondary | ICD-10-CM

## 2017-11-24 DIAGNOSIS — E669 Obesity, unspecified: Secondary | ICD-10-CM | POA: Diagnosis not present

## 2017-11-24 DIAGNOSIS — Z3A38 38 weeks gestation of pregnancy: Secondary | ICD-10-CM | POA: Diagnosis not present

## 2017-11-24 DIAGNOSIS — O9832 Other infections with a predominantly sexual mode of transmission complicating childbirth: Secondary | ICD-10-CM | POA: Diagnosis not present

## 2017-11-24 LAB — URINALYSIS, ROUTINE W REFLEX MICROSCOPIC
Bilirubin Urine: NEGATIVE
GLUCOSE, UA: NEGATIVE mg/dL
Ketones, ur: NEGATIVE mg/dL
Leukocytes, UA: NEGATIVE
Nitrite: NEGATIVE
PH: 7 (ref 5.0–8.0)
Protein, ur: 30 mg/dL — AB
Specific Gravity, Urine: 1.008 (ref 1.005–1.030)

## 2017-11-24 LAB — COMPREHENSIVE METABOLIC PANEL
ALT: 16 U/L (ref 0–44)
ANION GAP: 10 (ref 5–15)
AST: 27 U/L (ref 15–41)
Albumin: 2.8 g/dL — ABNORMAL LOW (ref 3.5–5.0)
Alkaline Phosphatase: 191 U/L — ABNORMAL HIGH (ref 38–126)
BUN: <5 mg/dL — ABNORMAL LOW (ref 6–20)
CO2: 16 mmol/L — ABNORMAL LOW (ref 22–32)
Calcium: 8.9 mg/dL (ref 8.9–10.3)
Chloride: 110 mmol/L (ref 98–111)
Creatinine, Ser: 0.54 mg/dL (ref 0.44–1.00)
Glucose, Bld: 92 mg/dL (ref 70–99)
POTASSIUM: 3.8 mmol/L (ref 3.5–5.1)
SODIUM: 136 mmol/L (ref 135–145)
Total Bilirubin: 0.8 mg/dL (ref 0.3–1.2)
Total Protein: 6.3 g/dL — ABNORMAL LOW (ref 6.5–8.1)

## 2017-11-24 LAB — CBC
HCT: 33.4 % — ABNORMAL LOW (ref 36.0–46.0)
Hemoglobin: 10.8 g/dL — ABNORMAL LOW (ref 12.0–15.0)
MCH: 30.6 pg (ref 26.0–34.0)
MCHC: 32.3 g/dL (ref 30.0–36.0)
MCV: 94.6 fL (ref 78.0–100.0)
PLATELETS: 241 10*3/uL (ref 150–400)
RBC: 3.53 MIL/uL — AB (ref 3.87–5.11)
RDW: 14.2 % (ref 11.5–15.5)
WBC: 9 10*3/uL (ref 4.0–10.5)

## 2017-11-24 LAB — PROTEIN / CREATININE RATIO, URINE
Creatinine, Urine: 58 mg/dL
PROTEIN CREATININE RATIO: 0.55 mg/mg{creat} — AB (ref 0.00–0.15)
TOTAL PROTEIN, URINE: 32 mg/dL

## 2017-11-24 LAB — RPR: RPR Ser Ql: NONREACTIVE

## 2017-11-24 LAB — POCT FERN TEST
POCT FERN TEST: POSITIVE
POCT Fern Test: POSITIVE

## 2017-11-24 LAB — TYPE AND SCREEN
ABO/RH(D): O POS
ANTIBODY SCREEN: NEGATIVE

## 2017-11-24 LAB — LACTATE DEHYDROGENASE: LDH: 175 U/L (ref 98–192)

## 2017-11-24 LAB — URIC ACID: Uric Acid, Serum: 4.7 mg/dL (ref 2.5–7.1)

## 2017-11-24 MED ORDER — LACTATED RINGERS IV SOLN: 500.0000 mL | Freq: Once | INTRAVENOUS | Status: DC

## 2017-11-24 MED ORDER — ONDANSETRON HCL 4 MG/2ML IJ SOLN: 4.0000 mg | Freq: Four times a day (QID) | INTRAMUSCULAR | Status: DC | PRN

## 2017-11-24 MED ORDER — PHENYLEPHRINE 40 MCG/ML (10ML) SYRINGE FOR IV PUSH (FOR BLOOD PRESSURE SUPPORT)
80.0000 ug | PREFILLED_SYRINGE | INTRAVENOUS | Status: DC | PRN
  Filled 2017-11-24: qty 5
  Filled 2017-11-24: qty 10

## 2017-11-24 MED ORDER — DIPHENHYDRAMINE HCL 50 MG/ML IJ SOLN: 12.5000 mg | INTRAMUSCULAR | Status: DC | PRN

## 2017-11-24 MED ORDER — LACTATED RINGERS IV SOLN
INTRAVENOUS | Status: DC
  Administered 2017-11-24 (×3): 125 mL/h via INTRAVENOUS
  Administered 2017-11-25 (×2): via INTRAVENOUS

## 2017-11-24 MED ORDER — TERBUTALINE SULFATE 1 MG/ML IJ SOLN
0.2500 mg | Freq: Once | INTRAMUSCULAR | Status: DC | PRN
  Filled 2017-11-24: qty 1

## 2017-11-24 MED ORDER — LACTATED RINGERS IV SOLN
500.0000 mL | INTRAVENOUS | Status: DC | PRN
  Administered 2017-11-24: 500 mL via INTRAVENOUS

## 2017-11-24 MED ORDER — LIDOCAINE HCL (PF) 1 % IJ SOLN
INTRAMUSCULAR | Status: DC | PRN
  Administered 2017-11-24 (×2): 4 mL via EPIDURAL

## 2017-11-24 MED ORDER — OXYTOCIN 40 UNITS IN LACTATED RINGERS INFUSION - SIMPLE MED
1.0000 m[IU]/min | INTRAVENOUS | Status: DC
  Administered 2017-11-25: 26 m[IU]/min via INTRAVENOUS
  Filled 2017-11-24: qty 1000

## 2017-11-24 MED ORDER — FENTANYL 2.5 MCG/ML BUPIVACAINE 1/10 % EPIDURAL INFUSION (WH - ANES)
14.0000 mL/h | INTRAMUSCULAR | Status: DC | PRN
  Administered 2017-11-24 – 2017-11-25 (×3): 14 mL/h via EPIDURAL
  Filled 2017-11-24 (×3): qty 100

## 2017-11-24 MED ORDER — PHENYLEPHRINE 40 MCG/ML (10ML) SYRINGE FOR IV PUSH (FOR BLOOD PRESSURE SUPPORT)
80.0000 ug | PREFILLED_SYRINGE | INTRAVENOUS | Status: DC | PRN
  Filled 2017-11-24: qty 5

## 2017-11-24 MED ORDER — LIDOCAINE HCL (PF) 1 % IJ SOLN
30.0000 mL | INTRAMUSCULAR | Status: AC | PRN
  Administered 2017-11-25: 30 mL via SUBCUTANEOUS
  Filled 2017-11-24: qty 30

## 2017-11-24 MED ORDER — SOD CITRATE-CITRIC ACID 500-334 MG/5ML PO SOLN: 30.0000 mL | ORAL | Status: DC | PRN

## 2017-11-24 MED ORDER — ACETAMINOPHEN 325 MG PO TABS: 650.0000 mg | ORAL_TABLET | ORAL | Status: DC | PRN

## 2017-11-24 MED ORDER — OXYCODONE-ACETAMINOPHEN 5-325 MG PO TABS: 1.0000 | ORAL_TABLET | ORAL | Status: DC | PRN

## 2017-11-24 MED ORDER — OXYCODONE-ACETAMINOPHEN 5-325 MG PO TABS: 2.0000 | ORAL_TABLET | ORAL | Status: DC | PRN

## 2017-11-24 MED ORDER — FENTANYL CITRATE (PF) 100 MCG/2ML IJ SOLN
50.0000 ug | INTRAMUSCULAR | Status: DC | PRN
  Administered 2017-11-24: 100 ug via INTRAVENOUS
  Filled 2017-11-24: qty 2

## 2017-11-24 MED ORDER — EPHEDRINE 5 MG/ML INJ
10.0000 mg | INTRAVENOUS | Status: DC | PRN
  Filled 2017-11-24: qty 2

## 2017-11-24 MED ORDER — OXYTOCIN 40 UNITS IN LACTATED RINGERS INFUSION - SIMPLE MED
1.0000 m[IU]/min | INTRAVENOUS | Status: DC
  Administered 2017-11-24: 10 m[IU]/min via INTRAVENOUS
  Administered 2017-11-24: 1 m[IU]/min via INTRAVENOUS
  Filled 2017-11-24: qty 1000

## 2017-11-24 MED ORDER — OXYTOCIN 40 UNITS IN LACTATED RINGERS INFUSION - SIMPLE MED: 2.5000 [IU]/h | INTRAVENOUS | Status: DC

## 2017-11-24 MED ORDER — OXYTOCIN BOLUS FROM INFUSION
500.0000 mL | Freq: Once | INTRAVENOUS | Status: AC
  Administered 2017-11-25: 500 mL via INTRAVENOUS

## 2017-11-24 NOTE — Anesthesia Pain Management Evaluation Note (Signed)
  [facility]NA Pain Management Visit Note  Patient: Albertine Patriciaorsha U Hostler, 27 y.o., female  "Hello I am a member of the anesthesia team at [facility]. We have an anesthesia team available at all times to provide care throughout the hospital, including epidural management and anesthesia for C-section. I don't know your plan for the delivery whether it a natural birth, water birth, IV sedation, nitrous supplementation, doula or epidural, but we want to meet your pain goals."   1.Was your pain managed to your expectations on prior hospitalizations?   No prior hospitalizations  2.What is your expectation for pain management during this hospitalization?     IV pain meds, possibly an epidural.  3.How can we help you reach that goal? Nursing interventions. Education about epidural anesthesia provided to the patient.  Record the patient's initial score and the patient's pain goal.   Pain: 6  Pain Goal: 9 [facility] wants you to be able to say your pain was always managed very well.  Crystalann Korf 11/24/2017

## 2017-11-24 NOTE — MAU Note (Signed)
Pt states water broke at 0300-clear. Contractions soon after now every 15 min. Pt reports good fetal movement.

## 2017-11-24 NOTE — Progress Notes (Addendum)
Cynthia Woodward is a 27 y.o. G1P0000 at 5351w6d admitted for rupture of membranes  Subjective: Patient feels some contractions, 7/10 intensity, declines pain medication for now.    Objective: BP (!) 119/56 (BP Location: Left Arm)   Pulse 86   Temp 98 F (36.7 C) (Oral)   Resp 16   Ht 5\' 5"  (1.651 m)   Wt 105.7 kg   LMP 03/04/2017   SpO2 98%   BMI 38.77 kg/m  No intake/output data recorded. No intake/output data recorded.  FHT:  FHR: 140 bpm, variability: moderate,  accelerations:  Present,  decelerations:  Absent UC:   irregular, every 5 to 8 minutes SVE:   Dilation: 2 Effacement (%): 50 Station: -3 Exam by:: MD Jerrie Schussler EFW by Leopold's: 7.5 lbs.   Labs: Lab Results  Component Value Date   WBC 9.0 11/24/2017   HGB 10.8 (L) 11/24/2017   HCT 33.4 (L) 11/24/2017   MCV 94.6 11/24/2017   PLT 241 11/24/2017   CMP     Component Value Date/Time   NA 136 11/24/2017 0514   K 3.8 11/24/2017 0514   CL 110 11/24/2017 0514   CO2 16 (L) 11/24/2017 0514   GLUCOSE 92 11/24/2017 0514   BUN <5 (L) 11/24/2017 0514   CREATININE 0.54 11/24/2017 0514   CALCIUM 8.9 11/24/2017 0514   PROT 6.3 (L) 11/24/2017 0514   ALBUMIN 2.8 (L) 11/24/2017 0514   AST 27 11/24/2017 0514   ALT 16 11/24/2017 0514   ALKPHOS 191 (H) 11/24/2017 0514   BILITOT 0.8 11/24/2017 0514   GFRNONAA >60 11/24/2017 0514   GFRAA >60 11/24/2017 0514   Urinalysis    Component Value Date/Time   COLORURINE YELLOW 11/24/2017 0544   APPEARANCEUR HAZY (A) 11/24/2017 0544   LABSPEC 1.008 11/24/2017 0544   PHURINE 7.0 11/24/2017 0544   GLUCOSEU NEGATIVE 11/24/2017 0544   HGBUR LARGE (A) 11/24/2017 0544   BILIRUBINUR NEGATIVE 11/24/2017 0544   KETONESUR NEGATIVE 11/24/2017 0544   PROTEINUR 30 (A) 11/24/2017 0544   UROBILINOGEN 0.2 09/17/2012 1043   NITRITE NEGATIVE 11/24/2017 0544   LEUKOCYTESUR NEGATIVE 11/24/2017 0544    Assessment / Plan: Early labor after SROM  Labor: Start pitocin for labor augmentation. We  discussed risks of prolonged rupture of membranes and management options of expectant management versus augmentation of labor with pitocin.  She desires pitocin start after discussing risks, benefits and alternatives of pitocin augmentation.  Use 1 X 1 protocol until 3cm then 2 x 2 protocol after 3 cm.  Preeclampsia:  Had initial elevated blood pressures that have recently improved.  Preeclampsia labs normal, f/u PCR ratio.  Fetal Wellbeing:  Category I Pain Control:  Epidural, IV pain meds and prn I/D:  GBS negative.  Anticipated MOD:  NSVD.  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   Konrad FelixKULWA,Mackay Hanauer WAKURU, MD.  11/24/2017, 10:23 AM

## 2017-11-24 NOTE — Progress Notes (Addendum)
Cynthia Woodward is a 27 y.o. G1P0000 at 5130w6d admitted for rupture of membranes  Subjective: Patient comfortable after epidural. Family curious about "when we would just do a C-section." Fetal intolerance of labor and labor dystocia discussed with patient and family. Reassurances given and patient still content to continue with augmented labor. RN to use peanut and repositioning to encourage further cervical pain.   Objective: Vitals:   11/24/17 1921 11/24/17 1926 11/24/17 1931 11/24/17 1936  BP: 135/86 130/80 131/82 125/79  Pulse: 92 79 86 87  Resp:      Temp:      TempSrc:      SpO2:      Weight:      Height:      ' FHT:  FHR: 130 bpm, variability: moderate,  accelerations:  Present,  decelerations:  Absent UC:   irregular, every 3-6 minutes SVE:   Dilation: 4 Effacement (%): 80 Station: -1 Exam by:: Cynthia Woodward CNM  Labs: Lab Results  Component Value Date   WBC 9.0 11/24/2017   HGB 10.8 (L) 11/24/2017   HCT 33.4 (L) 11/24/2017   MCV 94.6 11/24/2017   PLT 241 11/24/2017    Assessment / Plan: Augmentation of labor, progressing well  Labor: Progressing normally Preeclampsia:  no signs or symptoms of toxicity, intake and ouput balanced and labs stable Fetal Wellbeing:  Category I Pain Control:  Epidural I/D:  n/a Anticipated MOD:  NSVD  Cynthia Woodward 11/24/2017, 7:38 PM

## 2017-11-24 NOTE — Progress Notes (Signed)
Subjective: Pt had one dose of Fentanyl.  May want an epidural if it does not help.   Objective: BP 138/83   Pulse 83   Temp 98.6 F (37 C) (Oral)   Resp 18   Ht 5\' 5"  (1.651 m)   Wt 105.7 kg   LMP 03/04/2017   SpO2 98%   BMI 38.77 kg/m  No intake/output data recorded. Total I/O In: -  Out: 550 [Urine:550]  FHT: Category 1 FHT 130 accels no decels UC:   regular, every 3 minutes SVE:   Dilation: 4 Effacement (%): 70 Station: -2 Exam by:: J Middleton RN Pitocin at 7 mu   Assessment:  G1P0 at 37.6 IUP in early active labor with SROM x 14 hours Cat 1 strip  Plan: Epidural when needed Anticipate SVD  Cynthia Woodward CNM, MSN 11/24/2017, 4:59 PM

## 2017-11-24 NOTE — Anesthesia Preprocedure Evaluation (Signed)
Anesthesia Evaluation  Patient identified by MRN, date of birth, ID band Patient awake    Reviewed: Allergy & Precautions, Patient's Chart, lab work & pertinent test results  Airway Mallampati: III  TM Distance: >3 FB Neck ROM: Full    Dental no notable dental hx. (+) Teeth Intact   Pulmonary former smoker,    Pulmonary exam normal breath sounds clear to auscultation       Cardiovascular negative cardio ROS Normal cardiovascular exam Rhythm:Regular Rate:Normal     Neuro/Psych  Headaches, negative psych ROS   GI/Hepatic Neg liver ROS, GERD  ,  Endo/Other  Obesity  Renal/GU negative Renal ROS  negative genitourinary   Musculoskeletal negative musculoskeletal ROS (+)   Abdominal (+) + obese,   Peds  Hematology   Anesthesia Other Findings   Reproductive/Obstetrics (+) Pregnancy HSV                             Anesthesia Physical Anesthesia Plan  ASA: III  Anesthesia Plan: Epidural   Post-op Pain Management:    Induction:   PONV Risk Score and Plan:   Airway Management Planned: Natural Airway  Additional Equipment:   Intra-op Plan:   Post-operative Plan:   Informed Consent: I have reviewed the patients History and Physical, chart, labs and discussed the procedure including the risks, benefits and alternatives for the proposed anesthesia with the patient or authorized representative who has indicated his/her understanding and acceptance.     Plan Discussed with: Anesthesiologist  Anesthesia Plan Comments:         Anesthesia Quick Evaluation

## 2017-11-24 NOTE — Progress Notes (Signed)
Cynthia Woodward is a 27 y.o. G1P0000 at 7052w6d admitted for rupture of membranes  Subjective: Called to room by RN to report patient is feeling constant rectal pressure. On exam, fetal head was noted to be at 0 station. Cervix was posterior and is now entirely anterior but still 4cm and 80% effaced. Pitocin switched from 1x1 to 2x2 as patient's contractions were not adequately spaced.   Objective: Vitals:   11/24/17 1931 11/24/17 1936 11/24/17 2001 11/24/17 2031  BP: 131/82 125/79 (!) 112/54 (!) 107/58  Pulse: 86 87 73 77  Resp:  17 17   Temp:  97.9 F (36.6 C)    TempSrc:      SpO2:      Weight:      Height:      ' FHT:  FHR: 130 bpm, variability: moderate,  accelerations:  Present,  decelerations:  Absent UC:   irregular, every 3-6 minutes SVE:   Dilation: 4 Effacement (%): 80 Station: 0 Exam by:: Kathalene FramesEllis Leobardo Granlund CNM  Labs: Lab Results  Component Value Date   WBC 9.0 11/24/2017   HGB 10.8 (L) 11/24/2017   HCT 33.4 (L) 11/24/2017   MCV 94.6 11/24/2017   PLT 241 11/24/2017    Assessment / Plan: Augmentation of labor, progressing well  Labor: Progressing normally Preeclampsia:  no signs or symptoms of toxicity, intake and ouput balanced and labs stable Fetal Wellbeing:  Category I Pain Control:  Epidural I/D:  n/a Anticipated MOD:  NSVD  Janeece RiggersEllis K Jamichael Knotts 11/24/2017, 9:08 PM

## 2017-11-24 NOTE — H&P (Signed)
Cynthia Woodward is a 27 y.o. female presenting for spontaneous rupture of membranes and early labor. OB History    Gravida  1   Para  0   Term  0   Preterm  0   AB  0   Living  0     SAB  0   TAB  0   Ectopic  0   Multiple  0   Live Births  0          Past Medical History:  Diagnosis Date  . Headache(784.0)    chronic   . Infection    UTI   Past Surgical History:  Procedure Laterality Date  . KNEE SURGERY Left    dislocated 2008, surgical repeat 2011  . wisdom teeth removal  2006   Family History: family history includes Asthma in her father; Cancer in her paternal grandfather; Heart disease in her mother; Hypertension in her mother; Hyperthyroidism in her mother; Lupus in her paternal grandmother. Social History:  reports that she has quit smoking. Her smoking use included cigars. She has never used smokeless tobacco. She reports that she drank alcohol. She reports that she does not use drugs.     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  All other systems reviewed and are negative.  Maternal Medical History:  Reason for admission: Rupture of membranes and contractions.   Contractions: Onset was 3-5 hours ago.   Frequency: irregular.   Duration is approximately 80 seconds.   Perceived severity is moderate.    Fetal activity: Perceived fetal activity is normal.   Last perceived fetal movement was within the past hour.    Prenatal Complications - Diabetes: none.    Dilation: 1.5 Effacement (%): 50 Station: -2 Exam by::  TLYTLE RN   Vitals:   11/24/17 0405 11/24/17 0412  BP:  (!) 143/74  Pulse:  82  Resp:  18  Temp:  98.5 F (36.9 C)  TempSrc:  Oral  SpO2:  98%  Weight: 105.7 kg   Height: 5\' 5"  (1.651 m)      Maternal Exam:  Uterine Assessment: Contraction strength is moderate.  Contraction duration is 80 seconds. Contraction frequency is irregular.   Abdomen: Patient reports no abdominal tenderness. Fundal height is Size = dates.   Estimated fetal weight is 7lbs.   Fetal presentation: vertex  Introitus: Ferning test: positive.  Amniotic fluid character: clear.     Fetal Exam Fetal Monitor Review: Mode: ultrasound.   Baseline rate: 135.  Variability: moderate (6-25 bpm).   Pattern: accelerations present and no decelerations.    Fetal State Assessment: Category I - tracings are normal.     Physical Exam  Nursing note and vitals reviewed. Constitutional: She is oriented to person, place, and time. She appears well-developed and well-nourished.  HENT:  Head: Normocephalic.  Eyes: Pupils are equal, round, and reactive to light.  Cardiovascular: Normal rate, regular rhythm and normal heart sounds.  Respiratory: Effort normal and breath sounds normal.  Musculoskeletal: Normal range of motion.  Neurological: She is alert and oriented to person, place, and time.  Skin: Skin is warm and  dry.  Psychiatric: She has a normal mood and affect. Her behavior is normal. Judgment and thought content normal.    Prenatal labs: ABO, Rh: --/--/O POS (12/29 1404) Antibody:  Negative Rubella:  Immune RPR:   NR HBsAg:   NR HIV: Non Reactive (12/29 1404)  GBS:   Negative  Assessment/Plan: 27 y.o. G1P0 at [redacted]w[redacted]d Category 1 FHTs SROM Early Labor Admit to L&D Anticipate NSVD   Cynthia Woodward 11/24/2017, 6:25 AM

## 2017-11-24 NOTE — Anesthesia Procedure Notes (Signed)
Epidural Patient location during procedure: OB Start time: 11/24/2017 6:44 PM End time: 11/24/2017 6:52 PM  Staffing Anesthesiologist: Mal AmabileFoster, Reilyn Nelson, MD Performed: anesthesiologist   Preanesthetic Checklist Completed: patient identified, site marked, surgical consent, pre-op evaluation, timeout performed, IV checked, risks and benefits discussed and monitors and equipment checked  Epidural Patient position: sitting Prep: site prepped and draped and DuraPrep Patient monitoring: continuous pulse ox and blood pressure Approach: midline Location: L3-L4 Injection technique: LOR air  Needle:  Needle type: Tuohy  Needle gauge: 17 G Needle length: 9 cm and 9 Needle insertion depth: 8 cm Catheter type: closed end flexible Catheter size: 19 Gauge Catheter at skin depth: 10 cm Test dose: negative and Other  Assessment Events: blood not aspirated, injection not painful, no injection resistance, negative IV test and no paresthesia  Additional Notes Patient identified. Risks and benefits discussed including failed block, incomplete  Pain control, post dural puncture headache, nerve damage, paralysis, blood pressure Changes, nausea, vomiting, reactions to medications-both toxic and allergic and post Partum back pain. All questions were answered. Patient expressed understanding and wished to proceed. Sterile technique was used throughout procedure. Epidural site was Dressed with sterile barrier dressing. No paresthesias, signs of intravascular injection Or signs of intrathecal spread were encountered.  Patient was more comfortable after the epidural was dosed. Please see RN's note for documentation of vital signs and FHR which are stable.

## 2017-11-25 MED ORDER — IBUPROFEN 600 MG PO TABS
600.0000 mg | ORAL_TABLET | Freq: Four times a day (QID) | ORAL | Status: DC
  Administered 2017-11-25 – 2017-11-27 (×8): 600 mg via ORAL
  Filled 2017-11-25 (×8): qty 1

## 2017-11-25 MED ORDER — DIPHENHYDRAMINE HCL 25 MG PO CAPS: 25.0000 mg | ORAL_CAPSULE | Freq: Four times a day (QID) | ORAL | Status: DC | PRN

## 2017-11-25 MED ORDER — BENZOCAINE-MENTHOL 20-0.5 % EX AERO
1.0000 "application " | INHALATION_SPRAY | CUTANEOUS | Status: DC | PRN
  Administered 2017-11-25 – 2017-11-26 (×2): 1 via TOPICAL
  Filled 2017-11-25 (×2): qty 56

## 2017-11-25 MED ORDER — SIMETHICONE 80 MG PO CHEW
80.0000 mg | CHEWABLE_TABLET | ORAL | Status: DC | PRN
  Administered 2017-11-26: 80 mg via ORAL
  Filled 2017-11-25: qty 1

## 2017-11-25 MED ORDER — ZOLPIDEM TARTRATE 5 MG PO TABS: 5.0000 mg | ORAL_TABLET | Freq: Every evening | ORAL | Status: DC | PRN

## 2017-11-25 MED ORDER — ONDANSETRON HCL 4 MG/2ML IJ SOLN: 4.0000 mg | INTRAMUSCULAR | Status: DC | PRN

## 2017-11-25 MED ORDER — TETANUS-DIPHTH-ACELL PERTUSSIS 5-2.5-18.5 LF-MCG/0.5 IM SUSP: 0.5000 mL | Freq: Once | INTRAMUSCULAR | Status: DC

## 2017-11-25 MED ORDER — SENNOSIDES-DOCUSATE SODIUM 8.6-50 MG PO TABS
2.0000 | ORAL_TABLET | ORAL | Status: DC
  Administered 2017-11-25 – 2017-11-26 (×2): 2 via ORAL
  Filled 2017-11-25 (×2): qty 2

## 2017-11-25 MED ORDER — MISOPROSTOL 200 MCG PO TABS
ORAL_TABLET | ORAL | Status: AC
  Filled 2017-11-25: qty 5

## 2017-11-25 MED ORDER — DIBUCAINE 1 % RE OINT: 1.0000 "application " | TOPICAL_OINTMENT | RECTAL | Status: DC | PRN

## 2017-11-25 MED ORDER — PRENATAL MULTIVITAMIN CH: 1.0000 | ORAL_TABLET | Freq: Every day | ORAL | Status: DC

## 2017-11-25 MED ORDER — LACTATED RINGERS IV SOLN: INTRAVENOUS | Status: DC

## 2017-11-25 MED ORDER — PRENATAL MULTIVITAMIN CH
1.0000 | ORAL_TABLET | Freq: Every day | ORAL | Status: DC
  Administered 2017-11-25 – 2017-11-27 (×3): 1 via ORAL
  Filled 2017-11-25 (×3): qty 1

## 2017-11-25 MED ORDER — ACETAMINOPHEN 325 MG PO TABS: 650.0000 mg | ORAL_TABLET | ORAL | Status: DC | PRN

## 2017-11-25 MED ORDER — WITCH HAZEL-GLYCERIN EX PADS: 1.0000 "application " | MEDICATED_PAD | CUTANEOUS | Status: DC | PRN

## 2017-11-25 MED ORDER — COCONUT OIL OIL
1.0000 "application " | TOPICAL_OIL | Status: DC | PRN
  Administered 2017-11-25: 1 via TOPICAL
  Filled 2017-11-25: qty 120

## 2017-11-25 MED ORDER — ONDANSETRON HCL 4 MG PO TABS: 4.0000 mg | ORAL_TABLET | ORAL | Status: DC | PRN

## 2017-11-25 NOTE — Lactation Note (Signed)
This note was copied from a baby's chart. Lactation Consultation Note  Patient Name: Cynthia Woodward WUJWJ'XToday's Date: 11/25/2017 Reason for consult: Initial assessment;1st time breastfeeding;Early term 10737-38.6wks P1, female infant 10 hours old, DAT+ and ETI [facility] [place] Per mom, attended BF classes in her pregnancy. Per mom, has DEBP ([employer]) at home. Mom BF on right breast using the  cross cradle position, LC had mom wait until  infant's mouth was wide before latching,  with  Infant's tongue down, infant latched w/ audible swallowing heard. Infant feeding 10 minutes as LC left room. Mom will give infant any EBM back in a spoon after feedings. Mom is feeling confident with BF. LC discussed guidelines  and feeding behaviors in for caring for ETI Mom will feed according huger cues, 8 to 12 times within 24 hours including nights,  Mom will not exceed 3 hours without feeding infant. Reviewed Baby & Me book's Breastfeeding Basics.  Mom made aware of O/P services, breastfeeding support groups, community resources, and our phone # for post-discharge questions.    Maternal Data Formula Feeding for Exclusion: No Has patient been taught Hand Expression?: Yes(Mom demostrated hand expression to [facility] Does the patient have breastfeeding experience prior to this delivery?: No  Feeding Feeding Type: Breast Fed Length of feed: 10 min(Mom still BF as LC left room)  LATCH Score Latch: Grasps breast easily, tongue down, lips flanged, rhythmical sucking.  Audible Swallowing: Spontaneous and intermittent  Type of Nipple: Everted at rest and after stimulation  Comfort (Breast/Nipple): Soft / non-tender  Hold (Positioning): Assistance needed to correctly position infant at breast and maintain latch.  LATCH Score: 9  Interventions Interventions: Breast feeding basics reviewed;Assisted with latch;Skin to skin;Breast massage;Hand express;Position options;Support pillows;Adjust  position;Breast compression  Lactation Tools Discussed/Used WIC Program: Yes   Consult Status Consult Status: Follow-up Date: 11/26/17 Follow-up type: In-patient    Cynthia Woodward 11/25/2017, 9:57 PM

## 2017-11-25 NOTE — Anesthesia Postprocedure Evaluation (Signed)
Anesthesia Post Note  Patient: Cynthia Woodward  Procedure(s) Performed: AN AD HOC LABOR EPIDURAL     Patient location during evaluation: Mother Baby Anesthesia Type: Epidural Level of consciousness: awake and alert Pain management: pain level controlled Vital Signs Assessment: post-procedure vital signs reviewed and stable Respiratory status: spontaneous breathing, nonlabored ventilation and respiratory function stable Cardiovascular status: stable Postop Assessment: no headache, no backache, epidural receding, able to ambulate, adequate PO intake, no apparent nausea or vomiting and patient able to bend at knees Anesthetic complications: no    Last Vitals:  Vitals:   11/25/17 1400 11/25/17 1515  BP: 137/83 131/86  Pulse: (!) 129 (!) 115  Resp: 18 18  Temp: 36.7 C 36.6 C  SpO2: 99% 100%    Last Pain:  Vitals:   11/25/17 1516  TempSrc:   PainSc: 2    Pain Goal:                 [place]akes[place],[name]

## 2017-11-25 NOTE — Progress Notes (Signed)
Labor Progress Note  Subjective: Cynthia Woodward, 27 y.o., G1P0000, with an IUP @ 6684w0d, presented for spontaneous rupture of membranes and early labor. Pt resting in stable condition in bed, with family at bedside.  Patient Active Problem List   Diagnosis Date Noted  . Normal labor 11/24/2017   Objective: BP 123/75   Pulse (!) 101   Temp 98.7 F (37.1 C) (Oral)   Resp 16   Ht 5\' 5"  (1.651 m)   Wt 105.7 kg   LMP 03/04/2017   SpO2 98%   BMI 38.77 kg/m  I/O last 3 completed shifts: In: -  Out: 2000 [Urine:2000] Total I/O In: -  Out: 450 [Urine:450] NST: FHR baseline 135 bpm, Variability: moderate, Accelerations:present, Decelerations:  Present varibable down to 90 with quick resolution back to baseline of 120s., position change resolved decels. = Cat 2/Reactive CTX:  irregular, every 2-6 minutes, lasting 70-90seconds Uterus gravid, soft non tender, moderate to palpate with contractions.  SVE:  Dilation: 8 Effacement (%): 90 Station: Plus 1 Exam by:: Mikail Goostree CNM Pitocin at 26 mUn/min  Assessment:  Cynthia Woodward, 27 y.o., G1P0000, with an IUP @ 2984w0d, presented for spontaneous rupture of membranes and early labor. Pt stable now in bed.  Patient Active Problem List   Diagnosis Date Noted  . Normal labor 11/24/2017  NICHD: Category 2, with reposition variable return quickly.   Membranes:  08/22 @ 0300 x >24hrs, no s/s of infection  Induction:    Pitocin - 26  Pain management:                Epidural placement:  at 08/22 on 0712  GBS Negative   Plan: Continue labor plan Continuous monitoring Rest Frequent position changes to facilitate fetal rotation and descent. Will reassess with cervical exam at 1200 or earlier if necessary Continue pitocin per protocol, will d/c if fetal distress   Will start amnio if indicated.  Anticipate labor progression and vaginal delivery.   Md Pinn to be updated. Dale Montrose.   Adeli Frost, NP-C, CNM, MSN 11/25/2017. 10:04 AM

## 2017-11-25 NOTE — Progress Notes (Signed)
Cynthia Woodward is a 27 y.o. G1P0000 at 8351w6d admitted for rupture of membranes  Subjective: Patient is resting in high fowlers. Continues to feel pressure from fetal head.   Objective: Vitals:   11/24/17 2301 11/24/17 2331 11/25/17 0001 11/25/17 0031  BP: 105/60 122/63 (!) 115/59 (!) 109/50  Pulse: 92 86 90 (!) 138  Resp:   17   Temp:   98.7 F (37.1 C)   TempSrc:   Oral   SpO2:      Weight:      Height:      ' FHT:  FHR: 130 bpm, variability: moderate,  accelerations:  Present,  decelerations:  Absent UC:   irregular, every 4 minutes SVE:   Dilation: 5 Effacement (%): 80 Station: 0 Exam by:: Waynette ButteryGreer, CNM  Cervix is now anterior and soft, a good change from earlier exam.   Labs: Lab Results  Component Value Date   WBC 9.0 11/24/2017   HGB 10.8 (L) 11/24/2017   HCT 33.4 (L) 11/24/2017   MCV 94.6 11/24/2017   PLT 241 11/24/2017    Assessment / Plan: Augmentation of labor, progressing well  Labor: Progressing normally Preeclampsia:  no signs or symptoms of toxicity, intake and ouput balanced and labs stable Fetal Wellbeing:  Category I Pain Control:  Epidural I/D:  n/a Anticipated MOD:  NSVD  Janeece Riggersllis K Greer 11/25/2017, 1:10 AM

## 2017-11-25 NOTE — Progress Notes (Signed)
Cynthia Woodward is a 27 y.o. G1P0000 at 5828w6d admitted for rupture of membranes  Subjective: Patient is resting in high fowlers. Continues to feel pressure from fetal head.   Objective: Vitals:   11/25/17 0401 11/25/17 0430 11/25/17 0431 11/25/17 0501  BP: 133/77 107/80 107/80 121/74  Pulse: (!) 107 (!) 107 (!) 107 87  Resp:    18  Temp:    98.3 F (36.8 C)  TempSrc:    Oral  SpO2:      Weight:      Height:      ' FHT:  FHR: 130 bpm, variability: moderate,  accelerations:  Present,  decelerations:  Absent UC:   irregular, every 2-4 minutes SVE:   Dilation: 6.5 Effacement (%): 100 Station: 0 Exam by:: Kathalene FramesEllis Gared Gillie CNM   Labs: Lab Results  Component Value Date   WBC 9.0 11/24/2017   HGB 10.8 (L) 11/24/2017   HCT 33.4 (L) 11/24/2017   MCV 94.6 11/24/2017   PLT 241 11/24/2017    Assessment / Plan: Augmentation of labor, progressing well  Labor: Progressing normally Preeclampsia:  no signs or symptoms of toxicity, intake and ouput balanced and labs stable Fetal Wellbeing:  Category I Pain Control:  Epidural I/D:  n/a Anticipated MOD:  NSVD  Janeece RiggersEllis K Hudson Majkowski 11/25/2017, 5:09 AM

## 2017-11-25 NOTE — Progress Notes (Signed)
Labor Progress Note  Subjective: Cynthia Woodward, 27 y.o., G1P0000, with an IUP @ 8837w0d, presented for spontaneous rupture of membranes and early labor.I assumed care of pt , pt resting in bed sleeping well. Pt was woken for assessment, pt stable in NAD. Variables were noted, position changed resolved decles.  Patient Active Problem List   Diagnosis Date Noted  . Normal labor 11/24/2017   Objective: BP 136/82   Pulse (!) 104   Temp 98.7 F (37.1 C) (Oral)   Resp 17   Ht 5\' 5"  (1.651 m)   Wt 105.7 kg   LMP 03/04/2017   SpO2 98%   BMI 38.77 kg/m  I/O last 3 completed shifts: In: -  Out: 2000 [Urine:2000] Total I/O In: -  Out: 450 [Urine:450] NST: FHR baseline 120 bpm, Variability: moderate, Accelerations:present, Decelerations:  Present varibable down to 60 with quick resolution back to baseline of 120s., position change resolved decels. = Cat 2/Reactive CTX:  irregular, every 2-5 minutes, lasting 60-80seconds Uterus gravid, soft non tender, moderate to palpate with contractions.  SVE:  Dilation: 7 Effacement (%): 80 Station: 0 Exam by:: Sami Froh CNM Pitocin at 24 mUn/min  Assessment:  Cynthia Woodward, 27 y.o., G1P0000, with an IUP @ 6137w0d, presented for spontaneous rupture of membranes and early labor. Pt stable now in bed.  Patient Active Problem List   Diagnosis Date Noted  . Normal labor 11/24/2017  NICHD: Category 2, with reposition.   Membranes:  08/22 @ 0300 x >24hrs, no s/s of infection  Induction:    Pitocin - 24  Pain management:                Epidural placement:  at 08/22 on 0712  GBS Negative   Plan: Continue labor plan Continuous monitoring Rest Frequent position changes to facilitate fetal rotation and descent. Will reassess with cervical exam at 1000 or earlier if necessary Continue pitocin per protocol, will d/c if fetal distress  If variable continue will give amnio if indicated.  Anticipate labor progression and vaginal delivery.   Md Pinn  aware of plan and verbalized agreement.   Dale DurhamJade Alrick Cubbage, NP-C, CNM, MSN 11/25/2017. 8:44 AM

## 2017-11-26 LAB — CBC
HCT: 30.8 % — ABNORMAL LOW (ref 36.0–46.0)
HEMOGLOBIN: 10 g/dL — AB (ref 12.0–15.0)
MCH: 30.7 pg (ref 26.0–34.0)
MCHC: 32.5 g/dL (ref 30.0–36.0)
MCV: 94.5 fL (ref 78.0–100.0)
Platelets: 212 10*3/uL (ref 150–400)
RBC: 3.26 MIL/uL — ABNORMAL LOW (ref 3.87–5.11)
RDW: 14.3 % (ref 11.5–15.5)
WBC: 13.6 10*3/uL — AB (ref 4.0–10.5)

## 2017-11-26 NOTE — Progress Notes (Signed)
Subjective: Postpartum Day 1: Vaginal delivery, 1st laceration Patient up ad lib, reports no syncope or dizziness. Feeding:  Breastfeeding Contraceptive plan: Mirena  Objective: Vital signs in last 24 hours: Temp:  [97.8 F (36.6 C)-98.7 F (37.1 C)] 97.8 F (36.6 C) (08/24 0536) Pulse Rate:  [71-129] 83 (08/24 0536) Resp:  [16-18] 16 (08/24 0536) BP: (102-144)/(53-112) 132/81 (08/24 0536) SpO2:  [99 %-100 %] 100 % (08/24 0536)  Physical Exam:  General: alert, cooperative and no distress Lochia: appropriate Uterine Fundus: firm Perineum: healing well DVT Evaluation: No evidence of DVT seen on physical exam.   CBC Latest Ref Rng & Units 11/26/2017 11/24/2017 06/07/2017  WBC 4.0 - 10.5 K/uL 13.6(H) 9.0 7.1  Hemoglobin 12.0 - 15.0 g/dL 10.0(L) 10.8(L) 14.8  Hematocrit 36.0 - 46.0 % 30.8(L) 33.4(L) 43.2  Platelets 150 - 400 K/uL 212 241 230     Assessment/Plan: Status post vaginal delivery day 1. Stable Continue current care. Plan for discharge tomorrow and Breastfeeding    Henderson Newcomerancy Jean ProtheroCNM 11/26/2017, 10:02 AM

## 2017-11-27 MED ORDER — IBUPROFEN 600 MG PO TABS: 600.0000 mg | ORAL_TABLET | Freq: Four times a day (QID) | ORAL | 0 refills | Status: DC

## 2017-11-27 NOTE — Lactation Note (Signed)
This note was copied from a baby's chart. Lactation Consultation Note  Patient Name: Cynthia Iran Planasorsha Hillery WUJWJ'XToday's Date: 11/27/2017 Reason for consult: Follow-up assessment;Primapara;1st time breastfeeding;Early term 37-38.6wks;Infant weight loss(3% weight loss )  Baby is 345 hours old  Per mom breastfeeding is going well, during the night the baby was non - stop cluster feeding  And so I supplemented. Mom reports swallows with latch. Nipples are sensitive when baby was cluster feeding. Mom showed LC her nipples and LC had mom compress the areolas , some edema . LC instructed mom on the use of shells between feeding except when sleeping, and hand pump with #27 F is needed when the milk comes in.  Sore nipple and engorgement prevention and tx reviewed.  Per mom will also has a DEBP at home.  LC Discussed the importance of STS feedings until baby is back to birth weight and gaining steadily  And can stay awake for a feeding.  Mother informed of post-discharge support and given phone number to the lactation department, including services for phone call assistance; out-patient appointments; and breastfeeding support group. List of other breastfeeding resources in the community given in the handout. Encouraged mother to call for problems or concerns related to breastfeeding.   Maternal Data Has patient been taught Hand Expression?: Yes(LC reviewed )  Feeding Feeding Type: (recently fed per mom ) Length of feed: 10 min(per mom )  LATCH Score Latch: Grasps breast easily, tongue down, lips flanged, rhythmical sucking.  Audible Swallowing: A few with stimulation  Type of Nipple: Everted at rest and after stimulation  Comfort (Breast/Nipple): Soft / non-tender  Hold (Positioning): Assistance needed to correctly position infant at breast and maintain latch.  LATCH Score: 8  Interventions Interventions: Breast feeding basics reviewed;Shells;Hand pump  Lactation Tools Discussed/Used Tools:  Shells;Pump Shell Type: Inverted Breast pump type: Manual Pump Review: Milk Storage;Setup, frequency, and cleaning Initiated by:: MAI  Date initiated:: 11/27/17   Consult Status Consult Status: Complete Date: 11/27/17    Cynthia Woodward 11/27/2017, 9:42 AM

## 2017-11-27 NOTE — Discharge Summary (Signed)
Vacuum assisted vaginal delivery OB Discharge Summary     Patient Name: Cynthia Woodward DOB: Dec 06, 1990 MRN: [id number]  Date of admission: 11/24/2017 Delivering MD: Essie Hart  Date of delivery: 11/25/2017 Type of delivery: vacuum assisted vaginal delivery   Newborn Data: Sex: BG Circumcision: No Live born female  Birth Weight: 7 lb 1.2 oz (3210 g) APGAR: 3, 6  Newborn Delivery   Birth date/time:  11/25/2017 11:43:00 Delivery type:  Vaginal, Vacuum (Extractor)     Feeding: breast Infant being discharge to home with mother in stable condition.   Admitting diagnosis: 37 WEEKS CTX LEAKING FLUID  Intrauterine pregnancy: [redacted]w[redacted]d     Secondary diagnosis:  Active Problems:   Normal labor   Normal postpartum course                                Complications: None                                                              Intrapartum Procedures: vacuum Postpartum Procedures: none Complications-Operative and Postpartum: 1st degree perineal laceration Augmentation: Pitocin   History of Present Illness: Ms. Cynthia Woodward is a 27 y.o. female, G1P0000, who presents at [redacted]w[redacted]d weeks gestation. The patient has been followed at  [facility]  Her pregnancy has been complicated by: Patient Active Problem List   Diagnosis Date Noted  . Normal postpartum course 11/27/2017  . Normal labor 11/24/2017    Hospital course:  Onset of Labor With Vaginal Delivery     27 y.o. yo G1P0000 at [redacted]w[redacted]d was admitted in Latent Labor on 11/24/2017. Patient had an uncomplicated labor course as follows:  Membrane Rupture Time/Date: 3:00 AM ,11/24/2017   Intrapartum Procedures: Episiotomy: None [1]                                         Lacerations:  1st degree [2];Labial [10]  Patient had a delivery of a Viable infant. 11/25/2017  Information for the patient's newborn:  Cynthia, Woodward [[id number]]  Delivery Method: Vaginal, Vacuum (Extractor)(Filed from Delivery  Summary)    Pateint had an uncomplicated postpartum course.  She is ambulating, tolerating a regular diet, passing flatus, and urinating well. Patient is discharged home in stable condition on 11/27/17.  Postpartum Day # 3 : S/P NSVD due to spontaneous labor with SROM.Marland Kitchen Patient up ad lib, denies syncope or dizziness. Reports consuming regular diet without issues and denies N/V. Patient reports 0 bowel movement + passing flatus.  Denies issues with urination and reports bleeding is "light."  Patient is Breastfeeding and reports going well.  Desires IUD mirena at 6 weeks PPV for postpartum contraception.  Pain is being appropriately managed with use of motrin.   Physical exam  Vitals:   11/26/17 0536 11/26/17 1424 11/26/17 2142 11/27/17 0610  BP: 132/81 128/89 120/60 115/70  Pulse: 83 71 69 72  Resp: 16 18 16 18   Temp: 97.8 F (36.6 C) 97.9 F (36.6 C) 98.1 F (36.7 C) 98.2 F (36.8 C)  TempSrc: Oral Oral Oral   SpO2: 100% 100%  100%  Weight:      Height:       General: alert, cooperative and no distress Lochia: appropriate Uterine Fundus: firm Perineum: 1st degree repair, approximate, no hematoma no erythema, mild tenderness present.  DVT Evaluation: No evidence of DVT seen on physical exam. Negative Homan's sign. No cords or calf tenderness. No significant calf/ankle edema.  Labs: Lab Results  Component Value Date   WBC 13.6 (H) 11/26/2017   HGB 10.0 (L) 11/26/2017   HCT 30.8 (L) 11/26/2017   MCV 94.5 11/26/2017   PLT 212 11/26/2017   CMP Latest Ref Rng & Units 11/24/2017  Glucose 70 - 99 mg/dL 92  BUN 6 - 20 mg/dL <4(U<5(L)  Creatinine 9.810.44 - 1.00 mg/dL 1.910.54  Sodium 478135 - 295145 mmol/L 136  Potassium 3.5 - 5.1 mmol/L 3.8  Chloride 98 - 111 mmol/L 110  CO2 22 - 32 mmol/L 16(L)  Calcium 8.9 - 10.3 mg/dL 8.9  Total Protein 6.5 - 8.1 g/dL 6.3(L)  Total Bilirubin 0.3 - 1.2 mg/dL 0.8  Alkaline Phos 38 - 126 U/L 191(H)  AST 15 - 41 U/L 27  ALT 0 - 44 U/L 16    Date of  discharge: 11/27/2017 Discharge Diagnoses: Term Pregnancy-delivered Discharge instruction: per After Visit Summary and "Baby and Me Booklet".  After visit meds:  Allergies as of 11/27/2017   No Known Allergies     Medication List    STOP taking these medications   ondansetron 4 MG tablet Commonly known as:  ZOFRAN     TAKE these medications   ibuprofen 600 MG tablet Commonly known as:  ADVIL,MOTRIN Take 1 tablet (600 mg total) by mouth every 6 (six) hours.   prenatal multivitamin Tabs tablet Take 1 tablet by mouth daily at 12 noon.   valACYclovir 500 MG tablet Commonly known as:  VALTREX Take 500 mg by mouth daily.       Activity:           pelvic rest Advance as tolerated. Pelvic rest for 6 weeks.  Diet:                routine Medications: PNV and Ibuprofen Postpartum contraception: IUD Mirena at 6 weeks PPV Condition:  Pt discharge to home with baby in stable  Meds: Allergies as of 11/27/2017   No Known Allergies     Medication List    STOP taking these medications   ondansetron 4 MG tablet Commonly known as:  ZOFRAN     TAKE these medications   ibuprofen 600 MG tablet Commonly known as:  ADVIL,MOTRIN Take 1 tablet (600 mg total) by mouth every 6 (six) hours.   prenatal multivitamin Tabs tablet Take 1 tablet by mouth daily at 12 noon.   valACYclovir 500 MG tablet Commonly known as:  VALTREX Take 500 mg by mouth daily.       Discharge Follow Up:  Follow-up Information    [facility] 6 week(s).   Specialty:  Obstetrics and Gynecology Contact information: [address] [place] [phone]           HolyokeJade Shakea Isip, NP-C, CNM 11/27/2017, 11:56 AM  Dale DurhamMONTANA, Ceil Roderick, FNP

## 2017-12-07 ENCOUNTER — Inpatient Hospital Stay (HOSPITAL_COMMUNITY): Admission: RE | Admit: 2017-12-07 | Payer: BLUE CROSS/BLUE SHIELD | Source: Ambulatory Visit

## 2018-01-05 DIAGNOSIS — Z30430 Encounter for insertion of intrauterine contraceptive device: Secondary | ICD-10-CM | POA: Diagnosis not present

## 2018-01-05 DIAGNOSIS — Z3040 Encounter for surveillance of contraceptives, unspecified: Secondary | ICD-10-CM | POA: Diagnosis not present

## 2018-01-11 DIAGNOSIS — Z30430 Encounter for insertion of intrauterine contraceptive device: Secondary | ICD-10-CM | POA: Diagnosis not present

## 2018-01-11 DIAGNOSIS — M5489 Other dorsalgia: Secondary | ICD-10-CM | POA: Diagnosis not present

## 2018-02-09 DIAGNOSIS — Z30431 Encounter for routine checking of intrauterine contraceptive device: Secondary | ICD-10-CM | POA: Diagnosis not present

## 2018-03-03 ENCOUNTER — Encounter (HOSPITAL_COMMUNITY): Payer: Self-pay

## 2018-05-05 ENCOUNTER — Ambulatory Visit: Payer: BLUE CROSS/BLUE SHIELD | Admitting: Physician Assistant

## 2018-05-05 ENCOUNTER — Telehealth: Payer: Self-pay | Admitting: Physician Assistant

## 2018-05-05 ENCOUNTER — Encounter: Payer: Self-pay | Admitting: Physician Assistant

## 2018-05-05 NOTE — Telephone Encounter (Signed)
Pt no showed for appt on 05/05/2018. OK to  Reschedule NP appt?

## 2018-05-09 NOTE — Telephone Encounter (Signed)
Okay to reschedule per [facility].

## 2018-05-15 ENCOUNTER — Encounter (HOSPITAL_COMMUNITY): Payer: Self-pay | Admitting: Emergency Medicine

## 2018-05-15 ENCOUNTER — Ambulatory Visit (HOSPITAL_COMMUNITY)
Admission: EM | Admit: 2018-05-15 | Discharge: 2018-05-15 | Disposition: A | Discharge status: Home / Self Care | Payer: BLUE CROSS/BLUE SHIELD | Attending: Family Medicine | Admitting: Family Medicine

## 2018-05-15 DIAGNOSIS — R519 Headache, unspecified: Secondary | ICD-10-CM

## 2018-05-15 DIAGNOSIS — R51 Headache: Principal | ICD-10-CM

## 2018-05-15 DIAGNOSIS — G43909 Migraine, unspecified, not intractable, without status migrainosus: Secondary | ICD-10-CM | POA: Diagnosis not present

## 2018-05-15 MED ORDER — ESCITALOPRAM OXALATE 10 MG PO TABS: 10.0000 mg | ORAL_TABLET | Freq: Every day | ORAL | 0 refills | Status: DC

## 2018-05-15 MED ORDER — KETOROLAC TROMETHAMINE 60 MG/2ML IM SOLN
60.0000 mg | Freq: Once | INTRAMUSCULAR | Status: AC
  Administered 2018-05-15: 60 mg via INTRAMUSCULAR

## 2018-05-15 MED ORDER — KETOROLAC TROMETHAMINE 60 MG/2ML IM SOLN
INTRAMUSCULAR | Status: AC
  Filled 2018-05-15: qty 2

## 2018-05-15 NOTE — ED Triage Notes (Signed)
Pt c/o migraine since Friday, has tried several things to make it go away without relief.

## 2018-05-15 NOTE — ED Provider Notes (Signed)
[facility]    CSN: [id number] Arrival date & time: 05/15/18  1023     History   Chief Complaint Chief Complaint  Patient presents with  . Headache    HPI Cynthia Woodward is a 28 y.o. female.   Patient has history of migraine.  She had formally taken Lexapro which helped prevent headaches but has not had any for some time since she was pregnant.  Headache started 3 days ago.  It is typical for her.  No nausea or vomiting but anorexia.  No known triggers but she does endorse some tension and anxiety.  Not sure which came first anxiety or the headache and then anxiety  HPI  Past Medical History:  Diagnosis Date  . Headache(784.0)    chronic   . Infection    UTI    Patient Active Problem List   Diagnosis Date Noted  . Normal postpartum course 11/27/2017  . Normal labor 11/24/2017    Past Surgical History:  Procedure Laterality Date  . KNEE SURGERY Left    dislocated 2008, surgical repeat 2011  . wisdom teeth removal  2006    OB History    Gravida  1   Para  0   Term  0   Preterm  0   AB  0   Living  0     SAB  0   TAB  0   Ectopic  0   Multiple  0   Live Births  0            Home Medications    Prior to Admission medications   Medication Sig Start Date End Date Taking? Authorizing Provider  ibuprofen (ADVIL,MOTRIN) 600 MG tablet Take 1 tablet (600 mg total) by mouth every 6 (six) hours. 11/27/17   Dale Durham[place]ana, Jade, FNP  Prenatal Vit-Fe Fumarate-FA (PRENATAL MULTIVITAMIN) TABS tablet Take 1 tablet by mouth daily at 12 noon.    [provider]  valACYclovir (VALTREX) 500 MG tablet Take 500 mg by mouth daily.    [provider]    Family History Family History  Problem Relation Age of Onset  . Heart disease Mother        CHF  . Hyperthyroidism Mother   . Hypertension Mother   . Asthma Father   . Lupus Paternal Grandmother   . Cancer Paternal Grandfather     Social History Social History   Tobacco Use  .  Smoking status: Former Smoker    Types: Cigars  . Smokeless tobacco: Never Used  . Tobacco comment: black and mild; quit at Christmas  Substance Use Topics  . Alcohol use: Not Currently    Comment: occ.  . Drug use: No     Allergies   Patient has no known allergies.   Review of Systems Review of Systems  Constitutional: Positive for activity change.  Neurological: Positive for headaches.  Psychiatric/Behavioral: The patient is nervous/anxious.   All other systems reviewed and are negative.    Physical Exam Triage Vital Signs ED Triage Vitals  Enc Vitals Group     BP 05/15/18 1144 111/64     Pulse Rate 05/15/18 1143 60     Resp 05/15/18 1143 18     Temp 05/15/18 1143 98.2 F (36.8 C)     Temp src --      SpO2 05/15/18 1143 100 %     Weight --      Height --      Head  Circumference --      Peak Flow --      Pain Score 05/15/18 1143 8     Pain Loc --      Pain Edu? --      Excl. in GC? --    No data found.  Updated Vital Signs BP 111/64   Pulse 60   Temp 98.2 F (36.8 C)   Resp 18   LMP 05/15/2018 (LMP Unknown)   SpO2 100%   Visual Acuity Right Eye Distance:   Left Eye Distance:   Bilateral Distance:    Right Eye Near:   Left Eye Near:    Bilateral Near:     Physical Exam Constitutional:      Appearance: She is well-developed.  Eyes:     Extraocular Movements: Extraocular movements intact.     Pupils: Pupils are equal, round, and reactive to light.  Neck:     Musculoskeletal: Normal range of motion and neck supple.  Cardiovascular:     Rate and Rhythm: Regular rhythm.     Heart sounds: Normal heart sounds.  Neurological:     Mental Status: She is alert and oriented to person, place, and time.     Cranial Nerves: No cranial nerve deficit.  Psychiatric:        Mood and Affect: Mood normal.        Behavior: Behavior normal.      UC Treatments / Results  Labs (all labs ordered are listed, but only abnormal results are displayed) Labs  Reviewed - No data to display  EKG None  Radiology No results found.  Procedures Procedures (including critical care time)  Medications Ordered in UC Medications  ketorolac (TORADOL) injection 60 mg (has no administration in time range)    Initial Impression / Assessment and Plan / UC Course  I have reviewed the triage vital signs and the nursing notes.  Pertinent labs & imaging results that were available during my care of the patient were reviewed by me and considered in my medical decision making (see chart for details).     Migraine headache.  Will provide Rx for Lexapro with instructions to follow-up with her PCP for refill Final Clinical Impressions(s) / UC Diagnoses   Final diagnoses:  None   Discharge Instructions   None    ED Prescriptions    None     Controlled Substance Prescriptions [place] Controlled Substance Registry consulted? No   Frederica KusterMiller,  M, MD 05/15/18 1210

## 2018-06-09 ENCOUNTER — Other Ambulatory Visit: Payer: Self-pay | Admitting: Family Medicine

## 2018-08-23 DIAGNOSIS — M25512 Pain in left shoulder: Secondary | ICD-10-CM | POA: Diagnosis not present

## 2018-10-18 ENCOUNTER — Ambulatory Visit (HOSPITAL_COMMUNITY)
Admission: EM | Admit: 2018-10-18 | Discharge: 2018-10-18 | Disposition: A | Discharge status: Home / Self Care | Payer: BC Managed Care – PPO | Attending: Emergency Medicine | Admitting: Emergency Medicine

## 2018-10-18 ENCOUNTER — Other Ambulatory Visit: Payer: Self-pay

## 2018-10-18 ENCOUNTER — Encounter (HOSPITAL_COMMUNITY): Payer: Self-pay

## 2018-10-18 DIAGNOSIS — J309 Allergic rhinitis, unspecified: Secondary | ICD-10-CM

## 2018-10-18 MED ORDER — FEXOFENADINE HCL 60 MG PO TABS: 60.0000 mg | ORAL_TABLET | Freq: Two times a day (BID) | ORAL | 0 refills | Status: DC

## 2018-10-18 MED ORDER — FEXOFENADINE HCL 60 MG PO TABS: 60.0000 mg | ORAL_TABLET | Freq: Two times a day (BID) | ORAL | 0 refills | Status: AC

## 2018-10-18 MED ORDER — OLOPATADINE HCL 0.1 % OP SOLN: 1.0000 [drp] | Freq: Two times a day (BID) | OPHTHALMIC | 12 refills | Status: AC

## 2018-10-18 MED ORDER — FLUTICASONE PROPIONATE 50 MCG/ACT NA SUSP: 1.0000 | Freq: Every day | NASAL | 0 refills | Status: AC

## 2018-10-18 NOTE — Discharge Instructions (Addendum)
Restart allegra daily Flonase nasal spray 1-2 spray each nostril daily Olopatadine eye drops twice daily as needed  Follow up if not resolving

## 2018-10-18 NOTE — ED Provider Notes (Signed)
[place]    CSN: [id number] Arrival date & time: 10/18/18  6503      History   Chief Complaint Chief Complaint  Patient presents with  . Allergies    HPI Cynthia Woodward is a 28 y.o. female history of headaches, presenting today for evaluation of allergies.  Patient states that over the past day or 2 she has developed rhinorrhea, congestion, itchy throat as well as slightly watery eyes.  She denies any fevers chills or body aches.  Denies any fatigue.  Otherwise feels normal.  Denies nausea or vomiting.  Eating and drinking like normal with normal appetite.  Denies known exposure to COVID.  Previously has been on Allegra for allergies, but no longer has a PCP.  Feels other allergy medicines cause drowsiness for her.  She has tried Mucinex with minimal relief. HPI  Past Medical History:  Diagnosis Date  . Headache(784.0)    chronic   . Infection    UTI    Patient Active Problem List   Diagnosis Date Noted  . Normal postpartum course 11/27/2017  . Normal labor 11/24/2017    Past Surgical History:  Procedure Laterality Date  . KNEE SURGERY Left    dislocated 2008, surgical repeat 2011  . wisdom teeth removal  2006    OB History    Gravida  1   Para  0   Term  0   Preterm  0   AB  0   Living  0     SAB  0   TAB  0   Ectopic  0   Multiple  0   Live Births  0            Home Medications    Prior to Admission medications   Medication Sig Start Date End Date Taking? Authorizing Provider  fexofenadine (ALLEGRA) 60 MG tablet Take 1 tablet (60 mg total) by mouth 2 (two) times daily. 10/18/18   Pinky Ravan C, PA-C  fluticasone (FLONASE) 50 MCG/ACT nasal spray Place 1-2 sprays into both nostrils daily for 7 days. 10/18/18 10/25/18  Muaz Shorey C, PA-C  olopatadine (PATANOL) 0.1 % ophthalmic solution Place 1 drop into both eyes 2 (two) times daily. 10/18/18   Kristianne Albin C, PA-C  escitalopram (LEXAPRO) 10 MG tablet Take 1 tablet (10  mg total) by mouth daily. Patient not taking: Reported on 10/18/2018 05/15/18 10/18/18  Wardell Honour, MD    Family History Family History  Problem Relation Age of Onset  . Heart disease Mother        CHF  . Hyperthyroidism Mother   . Hypertension Mother   . Asthma Father   . Lupus Paternal Grandmother   . Cancer Paternal Grandfather     Social History Social History   Tobacco Use  . Smoking status: Former Smoker    Types: Cigars  . Smokeless tobacco: Never Used  . Tobacco comment: black and mild; quit at Christmas  Substance Use Topics  . Alcohol use: Not Currently    Comment: occ.  . Drug use: No     Allergies   Patient has no known allergies.   Review of Systems Review of Systems  Constitutional: Negative for activity change, appetite change, chills, fatigue and fever.  HENT: Positive for congestion, rhinorrhea and sore throat. Negative for ear pain, sinus pressure and trouble swallowing.   Eyes: Positive for discharge. Negative for redness.  Respiratory: Negative for cough, chest tightness and shortness of breath.  Cardiovascular: Negative for chest pain.  Gastrointestinal: Negative for abdominal pain, diarrhea, nausea and vomiting.  Musculoskeletal: Negative for myalgias.  Skin: Negative for rash.  Neurological: Negative for dizziness, light-headedness and headaches.     Physical Exam Triage Vital Signs ED Triage Vitals  Enc Vitals Group     BP 10/18/18 0941 111/74     Pulse Rate 10/18/18 0941 93     Resp 10/18/18 0941 16     Temp 10/18/18 0941 98.6 F (37 C)     Temp Source 10/18/18 0941 Oral     SpO2 10/18/18 0941 98 %     Weight 10/18/18 0939 206 lb (93.4 kg)     Height --      Head Circumference --      Peak Flow --      Pain Score 10/18/18 0939 1     Pain Loc --      Pain Edu? --      Excl. in GC? --    No data found.  Updated Vital Signs BP 111/74 (BP Location: Right Arm)   Pulse 93   Temp 98.6 F (37 C) (Oral)   Resp 16   Wt  206 lb (93.4 kg)   LMP 09/20/2018   SpO2 98%   BMI 34.28 kg/m   Visual Acuity Right Eye Distance:   Left Eye Distance:   Bilateral Distance:    Right Eye Near:   Left Eye Near:    Bilateral Near:     Physical Exam Vitals signs and nursing note reviewed.  Constitutional:      General: She is not in acute distress.    Appearance: She is well-developed.  HENT:     Head: Normocephalic and atraumatic.     Ears:     Comments: Bilateral ears without tenderness to palpation of external auricle, tragus and mastoid, EAC's without erythema or swelling, TM's with good bony landmarks and cone of light. Non erythematous.     Mouth/Throat:     Comments: Oral mucosa pink and moist, no tonsillar enlargement or exudate. Posterior pharynx patent and nonerythematous, no uvula deviation or swelling. Normal phonation.  Eyes:     Conjunctiva/sclera: Conjunctivae normal.  Neck:     Musculoskeletal: Neck supple.  Cardiovascular:     Rate and Rhythm: Normal rate and regular rhythm.     Heart sounds: No murmur.  Pulmonary:     Effort: Pulmonary effort is normal. No respiratory distress.     Breath sounds: Normal breath sounds.     Comments: Breathing comfortably at rest, CTABL, no wheezing, rales or other adventitious sounds auscultated Abdominal:     Palpations: Abdomen is soft.     Tenderness: There is no abdominal tenderness.  Skin:    General: Skin is warm and dry.  Neurological:     Mental Status: She is alert.      UC Treatments / Results  Labs (all labs ordered are listed, but only abnormal results are displayed) Labs Reviewed - No data to display  EKG   Radiology No results found.  Procedures Procedures (including critical care time)  Medications Ordered in UC Medications - No data to display  Initial Impression / Assessment and Plan / UC Course  I have reviewed the triage vital signs and the nursing notes.  Pertinent labs & imaging results that were available  during my care of the patient were reviewed by me and considered in my medical decision making (see chart for details).  Likely allergic rhinitis versus other viral URI.  Vital signs stable.  Clinic reinitiate on Allegra, Flonase, olopatadine eyedrops as needed for watery eyes.  Continue to monitor, COVID less likely at this time.  Advised follow-up if developing any fevers chills or fatigue.Discussed strict return precautions. Patient verbalized understanding and is agreeable with plan.  Final Clinical Impressions(s) / UC Diagnoses   Final diagnoses:  Allergic rhinitis, unspecified seasonality, unspecified trigger     Discharge Instructions     Restart allegra daily Flonase nasal spray 1-2 spray each nostril daily Olopatadine eye drops twice daily as needed  Follow up if not resolving   ED Prescriptions    Medication Sig Dispense Auth. Provider   fluticasone (FLONASE) 50 MCG/ACT nasal spray Place 1-2 sprays into both nostrils daily for 7 days. 1 g Heydy Montilla C, PA-C   fexofenadine (ALLEGRA) 60 MG tablet  (Status: Discontinued) Take 1 tablet (60 mg total) by mouth 2 (two) times daily. 30 tablet Shakaya Bhullar C, PA-C   olopatadine (PATANOL) 0.1 % ophthalmic solution Place 1 drop into both eyes 2 (two) times daily. 5 mL Erleen Egner C, PA-C   fexofenadine (ALLEGRA) 60 MG tablet Take 1 tablet (60 mg total) by mouth 2 (two) times daily. 30 tablet Zaide Kardell, GoldenHallie C, PA-C     Controlled Substance Prescriptions [place] Controlled Substance Registry consulted? Not Applicable   Lew DawesWieters, Emilyann Banka C, [place] JerseyPA-C 10/18/18 1000

## 2018-10-18 NOTE — ED Triage Notes (Signed)
Pt need a work note. Pt states she has allergies. Pt states she had a itchy throat ,watery eyes and runny nose. This started yesterday.

## 2018-12-20 ENCOUNTER — Other Ambulatory Visit: Payer: Self-pay | Admitting: Cardiology

## 2018-12-20 DIAGNOSIS — Z20822 Contact with and (suspected) exposure to covid-19: Secondary | ICD-10-CM

## 2018-12-21 LAB — NOVEL CORONAVIRUS, NAA: SARS-CoV-2, NAA: NOT DETECTED

## 2020-04-18 ENCOUNTER — Other Ambulatory Visit: Payer: Self-pay

## 2020-04-18 DIAGNOSIS — Z20822 Contact with and (suspected) exposure to covid-19: Secondary | ICD-10-CM

## 2020-04-20 LAB — SARS-COV-2, NAA 2 DAY TAT

## 2020-04-20 LAB — NOVEL CORONAVIRUS, NAA: SARS-CoV-2, NAA: NOT DETECTED

## 2020-10-01 ENCOUNTER — Encounter (HOSPITAL_COMMUNITY): Payer: Self-pay

## 2020-10-01 ENCOUNTER — Ambulatory Visit (HOSPITAL_COMMUNITY)
Admission: EM | Admit: 2020-10-01 | Discharge: 2020-10-01 | Disposition: A | Discharge status: Home / Self Care | Payer: Self-pay

## 2020-10-01 ENCOUNTER — Other Ambulatory Visit: Payer: Self-pay

## 2020-10-01 DIAGNOSIS — K529 Noninfective gastroenteritis and colitis, unspecified: Secondary | ICD-10-CM

## 2020-10-01 DIAGNOSIS — S46912A Strain of unspecified muscle, fascia and tendon at shoulder and upper arm level, left arm, initial encounter: Secondary | ICD-10-CM

## 2020-10-01 MED ORDER — TIZANIDINE HCL 4 MG PO TABS: 4.0000 mg | ORAL_TABLET | Freq: Four times a day (QID) | ORAL | 0 refills | Status: AC | PRN

## 2020-10-01 MED ORDER — ONDANSETRON 4 MG PO TBDP: 4.0000 mg | ORAL_TABLET | Freq: Three times a day (TID) | ORAL | 0 refills | Status: AC | PRN

## 2020-10-01 NOTE — ED Provider Notes (Signed)
[facility]    CSN: [id number] Arrival date & time: 10/01/20  1848      History   Chief Complaint Chief Complaint  Patient presents with   Abdominal Pain   Nausea   Shoulder Pain    HPI Cynthia Woodward is a 30 y.o. female.   Patient here for evaluation of nausea and vomiting that has been ongoing for the past few days.  Reports eating some questionable chicken and developed symptoms shortly after.  Reports having some abdominal pain and cramping associated with nausea and vomiting.  Denies any specific alleviating or aggravating factors. Also reports having some left shoulder pain that has been ongoing for the last month.  Reports decreased strength due to pain and decreased range of motion.  Denies any specific injury or trauma.  Denies any back pain.  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  Reports pain is achy and constant.  Denies any fevers, chest pain, shortness of breath, N/V/D, numbness, tingling, weakness, abdominal pain, or headaches.     The history is provided by the patient.  Abdominal Pain Associated symptoms: diarrhea, nausea and vomiting   Shoulder Pain  Past Medical History:  Diagnosis Date   Headache(784.0)    chronic    Infection    UTI    Patient Active Problem List   Diagnosis Date Noted   Normal postpartum course 11/27/2017   Normal labor 11/24/2017    Past Surgical History:  Procedure Laterality Date   KNEE SURGERY Left    dislocated 2008, surgical repeat 2011   wisdom teeth removal  2006    OB History     Gravida  1   Para  0   Term  0   Preterm  0   AB  0   Living  0      SAB  0   IAB  0   Ectopic  0   Multiple  0   Live Births  0            Home Medications    Prior to Admission medications   Medication Sig Start Date End Date Taking? Authorizing Provider  levonorgestrel (MIRENA) 20 MCG/DAY IUD 1 each by Intrauterine route once.   Yes [provider]   ondansetron (ZOFRAN ODT) 4 MG disintegrating tablet Take 1 tablet (4 mg total) by mouth every 8 (eight) hours as needed for nausea or vomiting. 10/01/20  Yes Ivette Loyal, NP  tiZANidine (ZANAFLEX) 4 MG tablet Take 1 tablet (4 mg total) by mouth every 6 (six) hours as needed for muscle spasms. 10/01/20  Yes Ivette Loyal, NP  fexofenadine (ALLEGRA) 60 MG tablet Take 1 tablet (60 mg total) by mouth 2 (two) times daily. 10/18/18   Wieters, Hallie C, PA-C  fluticasone (FLONASE) 50 MCG/ACT nasal spray Place 1-2 sprays into both nostrils daily for 7 days. 10/18/18 10/25/18  Wieters, Hallie C, PA-C  olopatadine (PATANOL) 0.1 % ophthalmic solution Place 1 drop into both eyes 2 (two) times daily. 10/18/18   Wieters, Hallie C, PA-C  escitalopram (LEXAPRO) 10 MG tablet Take 1 tablet (10 mg total) by mouth daily. Patient not taking: Reported on 10/18/2018 05/15/18 10/18/18  Frederica Kuster, MD    Family History Family History  Problem Relation Age of Onset   Heart disease Mother        CHF   Hyperthyroidism Mother    Hypertension Mother    Asthma Father  Lupus Paternal Grandmother    Cancer Paternal Grandfather     Social History Social History   Tobacco Use   Smoking status: Former    Pack years: 0.00    Types: Cigars   Smokeless tobacco: Never   Tobacco comments:    black and mild; quit at Christmas  Vaping Use   Vaping Use: Never used  Substance Use Topics   Alcohol use: Not Currently    Comment: occ.   Drug use: No     Allergies   Patient has no known allergies.   Review of Systems Review of Systems  Gastrointestinal:  Positive for abdominal pain, diarrhea, nausea and vomiting.  Musculoskeletal:  Positive for arthralgias.  All other systems reviewed and are negative.   Physical Exam Triage Vital Signs ED Triage Vitals  Enc Vitals Group     BP 10/01/20 1929 106/70     Pulse Rate 10/01/20 1929 82     Resp 10/01/20 1929 18     Temp 10/01/20 1929 99.4 F (37.4 C)      Temp Source 10/01/20 1929 Oral     SpO2 10/01/20 1929 97 %     Weight --      Height --      Head Circumference --      Peak Flow --      Pain Score 10/01/20 1928 10     Pain Loc --      Pain Edu? --      Excl. in GC? --    No data found.  Updated Vital Signs BP 106/70 (BP Location: Right Arm)   Pulse 82   Temp 99.4 F (37.4 C) (Oral)   Resp 18   SpO2 97%   Breastfeeding No   Visual Acuity Right Eye Distance:   Left Eye Distance:   Bilateral Distance:    Right Eye Near:   Left Eye Near:    Bilateral Near:     Physical Exam Vitals and nursing note reviewed.  Constitutional:      General: She is not in acute distress.    Appearance: Normal appearance. She is not ill-appearing, toxic-appearing or diaphoretic.  HENT:     Head: Normocephalic and atraumatic.  Eyes:     Conjunctiva/sclera: Conjunctivae normal.  Cardiovascular:     Rate and Rhythm: Normal rate.     Pulses: Normal pulses.  Pulmonary:     Effort: Pulmonary effort is normal.  Abdominal:     General: Abdomen is flat.     Tenderness: There is generalized abdominal tenderness. There is no right CVA tenderness, left CVA tenderness, guarding or rebound. Negative signs include Murphy's sign, Rovsing's sign, McBurney's sign, psoas sign and obturator sign.     Hernia: No hernia is present.  Musculoskeletal:        General: Normal range of motion.     Cervical back: Normal range of motion.  Skin:    General: Skin is warm and dry.  Neurological:     General: No focal deficit present.     Mental Status: She is alert and oriented to person, place, and time.  Psychiatric:        Mood and Affect: Mood normal.     UC Treatments / Results  Labs (all labs ordered are listed, but only abnormal results are displayed) Labs Reviewed - No data to display  EKG   Radiology No results found.  Procedures Procedures (including critical care time)  Medications Ordered in UC Medications -  No data to  display  Initial Impression / Assessment and Plan / UC Course  I have reviewed the triage vital signs and the nursing notes.  Pertinent labs & imaging results that were available during my care of the patient were reviewed by me and considered in my medical decision making (see chart for details).    Assessment negative for red flags or concerns.  Nausea, vomiting, diarrhea likely gastroenteritis versus food poisoning.  May take Zofran as needed for nausea and vomiting.  Recommended brat or bland food diet for the next few days and advancing as tolerated.  Use ginger or mint for nausea.  Encourage fluids and rest.  If symptoms do not improve in the next few days recommend going to the emergency room for further evaluation.  Otherwise follow-up with primary care. Shoulder pain likely shoulder strain versus radiculopathy.  May take Aleve or ibuprofen as needed for pain.  He may also take Tylenol tizanidine prescribed as needed for muscle pain and spasms but instructed not to take it prior to driving as it can cause drowsiness.  Encourage rest.  May use ice, heat, or alternate between heat and ice for comfort.  May also use OTC pain relief creams.  Recommend following up with orthopedics or sports medicine if symptoms do not improve. PCP assistance started.  Final Clinical Impressions(s) / UC Diagnoses   Final diagnoses:  Gastroenteritis  Shoulder strain, left, initial encounter     Discharge Instructions      For your nausea, vomiting, and diarrhea:  Take the Zofran as needed for nausea and vomiting.   Try a BRAT (bananas, rice, applesause, toast) or bland food diet for the next few days.  Stick with foods that are gentle on your stomach.  Avoid foods that are difficult to digest, such as diary.  As you feel better, you can start eating like you do normally.   You can use ginger (ginger ale, ginger candy) and mint for nausea.    Make sure you are drinking plenty of fluids, such as water,  powerade/gatorade, pedialyte, juices, and teas.    For your shoulder pain:  Take Ibuprofen or Aleve for pain.  You can also take Tylenol as needed.   Take the Zanaflex as needed for muscle pain and spasms.  Do not take it prior to driving as it can make you sleepy.  Rest as much as possible Ice for 10-15 minutes every 4-6 hours as needed for pain and swelling.  You can also try heat for comfort, or alternate between heat and ice.  You can also try pain relief creams, such as Icy Hot and Voltaren.  Do gentle stretching and exercises periodically to prevent your shoulder from getting stiff.    Follow up with sports medicine or orthopedics if symptoms do not improve.   Someone will contact you to help you get set up with a Primary Care Provider.           ED Prescriptions     Medication Sig Dispense Auth. Provider   ondansetron (ZOFRAN ODT) 4 MG disintegrating tablet Take 1 tablet (4 mg total) by mouth every 8 (eight) hours as needed for nausea or vomiting. 20 tablet Ivette Loyal, NP   tiZANidine (ZANAFLEX) 4 MG tablet Take 1 tablet (4 mg total) by mouth every 6 (six) hours as needed for muscle spasms. 30 tablet Ivette Loyal, NP      PDMP not reviewed this encounter.   Ivette Loyal, NP  10/01/20 1954  

## 2020-10-01 NOTE — Discharge Instructions (Addendum)
For your nausea, vomiting, and diarrhea:  Take the Zofran as needed for nausea and vomiting.   Try a BRAT (bananas, rice, applesause, toast) or bland food diet for the next few days.  Stick with foods that are gentle on your stomach.  Avoid foods that are difficult to digest, such as diary.  As you feel better, you can start eating like you do normally.   You can use ginger (ginger ale, ginger candy) and mint for nausea.    Make sure you are drinking plenty of fluids, such as water, powerade/gatorade, pedialyte, juices, and teas.    For your shoulder pain:  Take Ibuprofen or Aleve for pain.  You can also take Tylenol as needed.   Take the Zanaflex as needed for muscle pain and spasms.  Do not take it prior to driving as it can make you sleepy.  Rest as much as possible Ice for 10-15 minutes every 4-6 hours as needed for pain and swelling.  You can also try heat for comfort, or alternate between heat and ice.  You can also try pain relief creams, such as Icy Hot and Voltaren.  Do gentle stretching and exercises periodically to prevent your shoulder from getting stiff.    Follow up with sports medicine or orthopedics if symptoms do not improve.   Someone will contact you to help you get set up with a Primary Care Provider.

## 2020-10-01 NOTE — ED Triage Notes (Signed)
Pt presents with nausea and diarrhea x 2 days.   Bilateral shoulder pain x 1 month. Pt reports he cousin is a Engineer, civil (consulting) and told the pt  she has a lot of knot in the left arm. 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

## 2020-10-06 ENCOUNTER — Encounter: Payer: Self-pay | Admitting: *Deleted

## 2021-09-23 ENCOUNTER — Other Ambulatory Visit: Payer: Self-pay

## 2021-09-23 ENCOUNTER — Encounter (HOSPITAL_BASED_OUTPATIENT_CLINIC_OR_DEPARTMENT_OTHER): Payer: Self-pay

## 2021-09-23 ENCOUNTER — Emergency Department (HOSPITAL_BASED_OUTPATIENT_CLINIC_OR_DEPARTMENT_OTHER)
Admission: EM | Admit: 2021-09-23 | Discharge: 2021-09-23 | Disposition: A | Discharge status: Home / Self Care | Payer: Self-pay | Attending: Emergency Medicine | Admitting: Emergency Medicine

## 2021-09-23 DIAGNOSIS — G5701 Lesion of sciatic nerve, right lower limb: Secondary | ICD-10-CM | POA: Insufficient documentation

## 2021-09-23 DIAGNOSIS — M5431 Sciatica, right side: Secondary | ICD-10-CM

## 2021-09-23 MED ORDER — IBUPROFEN 800 MG PO TABS: 800.0000 mg | ORAL_TABLET | Freq: Three times a day (TID) | ORAL | 0 refills | Status: AC

## 2021-09-23 MED ORDER — OXYCODONE HCL 5 MG PO TABS: 5.0000 mg | ORAL_TABLET | Freq: Four times a day (QID) | ORAL | 0 refills | Status: AC | PRN

## 2021-09-23 MED ORDER — METHYLPREDNISOLONE SODIUM SUCC 125 MG IJ SOLR
125.0000 mg | Freq: Once | INTRAMUSCULAR | Status: AC
  Administered 2021-09-23: 125 mg via INTRAMUSCULAR
  Filled 2021-09-23: qty 2

## 2021-09-23 MED ORDER — IBUPROFEN 800 MG PO TABS
800.0000 mg | ORAL_TABLET | Freq: Once | ORAL | Status: AC
  Administered 2021-09-23: 800 mg via ORAL
  Filled 2021-09-23: qty 1

## 2021-09-23 MED ORDER — STERILE WATER FOR INJECTION IJ SOLN
INTRAMUSCULAR | Status: AC
  Administered 2021-09-23: 2 mL via INTRAMUSCULAR
  Filled 2021-09-23: qty 10

## 2021-09-23 NOTE — ED Provider Notes (Signed)
[facility] EMERGENCY DEPT Provider Note   CSN: [id number] Arrival date & time: 09/23/21  0957     History  Chief Complaint  Patient presents with   Back Pain    Cynthia Woodward is a 31 y.o. female.  Patient is a 31 year old female with past medical history of right-sided sciatica presenting for complaints of worsening sciatica.  Patient admits to bilateral lower back pain and describes radiation down the posterior right leg stopping before the calf.  Patient denies any spinal injections, fevers, bowel or urinary incontinence, sensation or motor deficits, or numbness/tingling in the upper inner thighs.  Admits to difficulty ambulating and sleeping due to pain.  The history is provided by the patient. No language interpreter was used.  Back Pain Associated symptoms: no abdominal pain, no chest pain, no dysuria and no fever        Home Medications Prior to Admission medications   Medication Sig Start Date End Date Taking? Authorizing Provider  ibuprofen (ADVIL) 800 MG tablet Take 1 tablet (800 mg total) by mouth 3 (three) times daily. 09/23/21  Yes Edwin Dada P, DO  oxyCODONE (ROXICODONE) 5 MG immediate release tablet Take 1 tablet (5 mg total) by mouth every 6 (six) hours as needed for up to 3 days for severe pain. 09/23/21 09/26/21 Yes Edwin Dada P, DO  fexofenadine (ALLEGRA) 60 MG tablet Take 1 tablet (60 mg total) by mouth 2 (two) times daily. 10/18/18   Wieters, Hallie C, PA-C  fluticasone (FLONASE) 50 MCG/ACT nasal spray Place 1-2 sprays into both nostrils daily for 7 days. 10/18/18 10/25/18  Wieters, Hallie C, PA-C  levonorgestrel (MIRENA) 20 MCG/DAY IUD 1 each by Intrauterine route once.    [provider]  olopatadine (PATANOL) 0.1 % ophthalmic solution Place 1 drop into both eyes 2 (two) times daily. 10/18/18   Wieters, Hallie C, PA-C  ondansetron (ZOFRAN ODT) 4 MG disintegrating tablet Take 1 tablet (4 mg total) by mouth every 8 (eight) hours as needed  for nausea or vomiting. 10/01/20   Ivette Loyal, NP  tiZANidine (ZANAFLEX) 4 MG tablet Take 1 tablet (4 mg total) by mouth every 6 (six) hours as needed for muscle spasms. 10/01/20   Ivette Loyal, NP  escitalopram (LEXAPRO) 10 MG tablet Take 1 tablet (10 mg total) by mouth daily. Patient not taking: Reported on 10/18/2018 05/15/18 10/18/18  Frederica Kuster, MD      Allergies    Patient has no known allergies.    Review of Systems   Review of Systems  Constitutional:  Negative for chills and fever.  HENT:  Negative for ear pain and sore throat.   Eyes:  Negative for pain and visual disturbance.  Respiratory:  Negative for cough and shortness of breath.   Cardiovascular:  Negative for chest pain and palpitations.  Gastrointestinal:  Negative for abdominal pain and vomiting.  Genitourinary:  Negative for dysuria and hematuria.  Musculoskeletal:  Positive for back pain. Negative for arthralgias.  Skin:  Negative for color change and rash.  Neurological:  Negative for seizures and syncope.  All other systems reviewed and are negative.   Physical Exam Updated Vital Signs BP 136/75 (BP Location: Right Arm)   Pulse 61   Temp 98.1 F (36.7 C)   Resp 14   Ht 5\' 5"  (1.651 m)   Wt 83.9 kg   SpO2 100%   BMI 30.79 kg/m  Physical Exam Vitals and nursing note reviewed.  Constitutional:  General: She is not in acute distress.    Appearance: She is well-developed.  HENT:     Head: Normocephalic and atraumatic.  Eyes:     Conjunctiva/sclera: Conjunctivae normal.  Cardiovascular:     Rate and Rhythm: Normal rate and regular rhythm.     Heart sounds: No murmur heard. Pulmonary:     Effort: Pulmonary effort is normal. No respiratory distress.     Breath sounds: Normal breath sounds.  Abdominal:     Palpations: Abdomen is soft.     Tenderness: There is no abdominal tenderness.  Musculoskeletal:        General: No swelling.     Cervical back: Neck supple.     Thoracic back: No  bony tenderness.     Lumbar back: No bony tenderness.       Legs:     Comments: Exacerbation of symptoms with palpation of the piriformis muscle.  Skin:    General: Skin is warm and dry.     Capillary Refill: Capillary refill takes less than 2 seconds.  Neurological:     Mental Status: She is alert and oriented to person, place, and time.     GCS: GCS eye subscore is 4. GCS verbal subscore is 5. GCS motor subscore is 6.     Sensory: Sensation is intact.     Motor: Motor function is intact.  Psychiatric:        Mood and Affect: Mood normal.     ED Results / Procedures / Treatments   Labs (all labs ordered are listed, but only abnormal results are displayed) Labs Reviewed - No data to display  EKG None  Radiology No results found.  Procedures Procedures    Medications Ordered in ED Medications  methylPREDNISolone sodium succinate (SOLU-MEDROL) 125 mg/2 mL injection 125 mg (has no administration in time range)  ibuprofen (ADVIL) tablet 800 mg (has no administration in time range)    ED Course/ Medical Decision Making/ A&P                           Medical Decision Making Risk Prescription drug management.   39:61 PM  31 year old female with past medical history of right-sided sciatica presenting for complaints of worsening sciatica.  Is alert and oriented x3, no acute distress, afebrile, stable vital signs.  Physical exam demonstrates exacerbation of symptoms with palpation of the piriformis muscle.  Leg neurovascularly intact.  No midline spinal back pain.  Steroids injected.  Patient given Motrin 800 and prescriptions for stronger pain meds sent to pharmacy.  Symptoms likely secondary to piriformis syndrome.  No concerning signs or symptoms of cauda equina syndrome.  Patient in no distress and overall condition improved here in the ED. Detailed discussions were had with the patient regarding current findings, and need for close f/u with PCP or on call doctor. The  patient has been instructed to return immediately if the symptoms worsen in any way for re-evaluation. Patient verbalized understanding and is in agreement with current care plan. All questions answered prior to discharge.         Final Clinical Impression(s) / ED Diagnoses Final diagnoses:  Piriformis syndrome of right side  Sciatica of right side    Rx / DC Orders ED Discharge Orders          Ordered    oxyCODONE (ROXICODONE) 5 MG immediate release tablet  Every 6 hours PRN  09/23/21 1233    ibuprofen (ADVIL) 800 MG tablet  3 times daily        09/23/21 1233              Franne Forts, DO 09/23/21 1233

## 2021-09-23 NOTE — ED Triage Notes (Signed)
Has chronic back pain worse since Monday.  Mostly lower back worse on right side radiating down right leg.

## 2022-10-30 LAB — AMB RESULTS CONSOLE CBG: Glucose: 154
